# Patient Record
Sex: Male | Born: 1950
Health system: Southern US, Community
[De-identification: ages and names within clinical notes are randomized; demographics above are authoritative.]

## PROBLEM LIST (undated history)

## (undated) DIAGNOSIS — I739 Peripheral vascular disease, unspecified: Secondary | ICD-10-CM

## (undated) DIAGNOSIS — I10 Essential (primary) hypertension: Secondary | ICD-10-CM

## (undated) DIAGNOSIS — B1920 Unspecified viral hepatitis C without hepatic coma: Secondary | ICD-10-CM

## (undated) HISTORY — DX: Unspecified viral hepatitis C without hepatic coma: B19.20

## (undated) HISTORY — DX: Peripheral vascular disease, unspecified: I73.9

## (undated) HISTORY — DX: Essential (primary) hypertension: I10

---

## 2016-06-04 HISTORY — PX: LIPOMA EXCISION: SHX5283

## 2019-06-15 ENCOUNTER — Encounter: Payer: Self-pay | Admitting: Internal Medicine

## 2019-06-15 ENCOUNTER — Other Ambulatory Visit: Payer: Self-pay

## 2019-06-15 ENCOUNTER — Ambulatory Visit (INDEPENDENT_AMBULATORY_CARE_PROVIDER_SITE_OTHER): Payer: Medicare HMO | Admitting: Internal Medicine

## 2019-06-15 VITALS — BP 132/70 | HR 62 | Temp 97.4°F | Ht 65.5 in | Wt 121.0 lb

## 2019-06-15 DIAGNOSIS — R6889 Other general symptoms and signs: Secondary | ICD-10-CM | POA: Diagnosis not present

## 2019-06-15 DIAGNOSIS — F17210 Nicotine dependence, cigarettes, uncomplicated: Secondary | ICD-10-CM | POA: Diagnosis not present

## 2019-06-15 DIAGNOSIS — R109 Unspecified abdominal pain: Secondary | ICD-10-CM | POA: Diagnosis not present

## 2019-06-15 DIAGNOSIS — I739 Peripheral vascular disease, unspecified: Secondary | ICD-10-CM

## 2019-06-15 DIAGNOSIS — I10 Essential (primary) hypertension: Secondary | ICD-10-CM | POA: Diagnosis not present

## 2019-06-15 LAB — COMPREHENSIVE METABOLIC PANEL
ALT: 19 U/L (ref 0–53)
AST: 21 U/L (ref 0–37)
Albumin: 4.2 g/dL (ref 3.5–5.2)
Alkaline Phosphatase: 151 U/L — ABNORMAL HIGH (ref 39–117)
BUN: 11 mg/dL (ref 6–23)
CO2: 33 mEq/L — ABNORMAL HIGH (ref 19–32)
Calcium: 9.6 mg/dL (ref 8.4–10.5)
Chloride: 99 mEq/L (ref 96–112)
Creatinine, Ser: 0.74 mg/dL (ref 0.40–1.50)
GFR: 126.72 mL/min (ref >60.00)
Glucose, Bld: 102 mg/dL — ABNORMAL HIGH (ref 70–99)
Potassium: 4 mEq/L (ref 3.5–5.1)
Sodium: 138 mEq/L (ref 135–145)
Total Bilirubin: 0.3 mg/dL (ref 0.2–1.2)
Total Protein: 7.1 g/dL (ref 6.0–8.3)

## 2019-06-15 LAB — CBC
HCT: 38.9 % — ABNORMAL LOW (ref 39.0–52.0)
Hemoglobin: 12.7 g/dL — ABNORMAL LOW (ref 13.0–17.0)
MCHC: 32.7 g/dL (ref 30.0–36.0)
MCV: 89.5 fl (ref 78.0–100.0)
Platelets: 219 10*3/uL (ref 150.0–400.0)
RBC: 4.35 Mil/uL (ref 4.22–5.81)
RDW: 13.5 % (ref 11.5–15.5)
WBC: 10.4 10*3/uL (ref 4.0–10.5)

## 2019-06-15 LAB — LIPID PANEL
Cholesterol: 196 mg/dL (ref 0–200)
HDL: 39.5 mg/dL (ref >39.00)
LDL Cholesterol: 124 mg/dL — ABNORMAL HIGH (ref 0–99)
NonHDL: 156.95
Total CHOL/HDL Ratio: 5
Triglycerides: 166 mg/dL — ABNORMAL HIGH (ref 0.0–149.0)
VLDL: 33.2 mg/dL (ref 0.0–40.0)

## 2019-06-15 LAB — LIPASE: Lipase: 135 U/L — ABNORMAL HIGH (ref 11.0–59.0)

## 2019-06-15 MED ORDER — OMEPRAZOLE 20 MG PO CPDR
20.0000 mg | DELAYED_RELEASE_CAPSULE | Freq: Every day | ORAL | 11 refills | Status: DC
Start: 2019-06-15 — End: 2019-07-16

## 2019-06-15 NOTE — Assessment & Plan Note (Signed)
Goes back for some time Had CT in [place] (12/20)---suggestive of gastritis and also constipation Colon in 2016--reportedly benign Will try omeprazole and miralax See back in 1 month or so---consider referral to GI

## 2019-06-15 NOTE — Assessment & Plan Note (Signed)
BP Readings from Last 3 Encounters:  06/15/19 132/70   Good control on the amlodipine Will check labs

## 2019-06-15 NOTE — Assessment & Plan Note (Signed)
On pletal No true claudication--but doesn't sound like he does much exertion Discussed the cigarettes

## 2019-06-15 NOTE — Progress Notes (Signed)
Subjective:    Patient ID: Dylan Kaufman, male    DOB: [birth year], 69 y.o.   MRN: [id number]  HPI Here with wife to establish care Moved from [place] --wife is from here This visit occurred during the SARS-CoV-2 public health emergency.  Safety protocols were in place, including screening questions prior to the visit, additional usage of staff PPE, and extensive cleaning of exam room while observing appropriate contact time as indicated for disinfecting solutions.   He retired in 2019 Currently renting a place locally  Known HTN---goes back ~6 years Doing well on the medication No regular exercise No chest pain or SOB  Having a lot of stomach pain Burning sensation in lower abdomen--usually before eating Gets some nausea and occasional vomiting Goes back a year---did try pepcid AC (vomited it back once--and may have helped at first) Feels it was a gas issue Is constipated---dulcolax occasionally Pain may be relieved with moving bowels No blood in stool Did have colonoscopy ~2017 (reportedly okay)  Shows me tests of PVD Severe arterial disease Is limited in how far he can walk  Still smoking ~1/3-1/4th PPD now No regular cough Breathing is okay  Current Outpatient Medications on File Prior to Visit  Medication Sig Dispense Refill  . amLODipine (NORVASC) 10 MG tablet Take 10 mg by mouth daily.    . cilostazol (PLETAL) 50 MG tablet Take 50 mg by mouth 2 (two) times daily.    . Multiple Vitamins-Minerals (CENTRUM SILVER 50+MEN) TABS Take by mouth.     No current facility-administered medications on file prior to visit.    No Known Allergies  History reviewed. No pertinent past medical history.  History reviewed. No pertinent surgical history.  Family History  Problem Relation Age of Onset  . HIV/AIDS Brother     Social History   Socioeconomic History  . Marital status: Married    Spouse name: Not on file  . Number of children: 3  . Years of education: Not  on file  . Highest education level: Not on file  Occupational History  . 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 Smoking status: Current Every Day Smoker    Start date: 17  . Smokeless tobacco: Never Used  Substance and Sexual Activity  . Alcohol use: Yes    Comment: occ beer  . Drug use: Not on file  . Sexual activity: Not on file  Other Topics Concern  . Not on file  Social History Narrative  . Not on file   Social Determinants of Health   Financial Resource Strain:   . Difficulty of Paying Living Expenses:   Food Insecurity:   . Worried About Charity fundraiser in the Last Year:   . Arboriculturist in the Last Year:   Transportation Needs:   . Film/video editor (Medical):   Marland Kitchen Lack of Transportation (Non-Medical):   Physical Activity:   . Days of Exercise per Week:   . Minutes of Exercise per Session:   Stress:   . Feeling of Stress :   Social Connections:   . Frequency of Communication with Friends and Family:   . Frequency of Social Gatherings with Friends and Family:   . Attends Religious Services:   . Active Member of Clubs or Organizations:   . Attends Archivist Meetings:   Marland Kitchen Marital Status:   Intimate Partner Violence:   . Fear of Current or Ex-Partner:   .  Emotionally Abused:   Marland Kitchen Physically Abused:   . Sexually Abused:    Review of Systems Appetite is okay Weight is down some----10# or so Sleeping better now--occasional awakening Voids okay---stream is fine.nocturia x 1-2 No depression or anhedonia Not anxious    Objective:   Physical Exam  Constitutional: No distress.  HENT:  Mouth/Throat: Oropharynx is clear and moist.  Full dentures No oral lesions  Neck: No thyromegaly present.  Cardiovascular: Normal rate, regular rhythm and normal heart sounds. Exam reveals no gallop.  No murmur heard. Absent pedal pulses  Respiratory: Effort normal and breath sounds normal. No respiratory  distress. He has no wheezes. He has no rales.  GI: Soft. He exhibits no distension. There is no abdominal tenderness. There is no rebound and no guarding.  Musculoskeletal:        General: No edema.     Comments: Large lipoma right upper back  Lymphadenopathy:    He has no cervical adenopathy.  Psychiatric: He has a normal mood and affect. His behavior is normal.           Assessment & Plan:

## 2019-06-15 NOTE — Assessment & Plan Note (Signed)
Discussed cessation  Patch/gum nicotine for now

## 2019-06-15 NOTE — Patient Instructions (Signed)
Please start the omeprazole (prescription) daily on an empty stomach (bedtime would be a good time). Also start miralax (over the counter) 1 full capful in a glass of water daily (to try to get your bowels more regular).  I would like you to stop smoking. Start an over the counter nicotine patch 21 or 22mg  daily. Then stop all cigarettes. You can use nicotine gum as needed to help you if you feel a craving for a cigarette (but don't smoke!)  Please get your COVID vaccine

## 2019-07-16 ENCOUNTER — Other Ambulatory Visit: Payer: Self-pay

## 2019-07-16 ENCOUNTER — Encounter: Payer: Self-pay | Admitting: Nurse Practitioner

## 2019-07-16 ENCOUNTER — Encounter: Payer: Self-pay | Admitting: Internal Medicine

## 2019-07-16 ENCOUNTER — Ambulatory Visit (INDEPENDENT_AMBULATORY_CARE_PROVIDER_SITE_OTHER): Payer: Medicare HMO | Admitting: Internal Medicine

## 2019-07-16 VITALS — BP 132/82 | HR 72 | Temp 98.3°F | Ht 66.0 in | Wt 127.0 lb

## 2019-07-16 DIAGNOSIS — R1013 Epigastric pain: Secondary | ICD-10-CM | POA: Diagnosis not present

## 2019-07-16 DIAGNOSIS — M545 Low back pain, unspecified: Secondary | ICD-10-CM

## 2019-07-16 DIAGNOSIS — R6889 Other general symptoms and signs: Secondary | ICD-10-CM | POA: Diagnosis not present

## 2019-07-16 MED ORDER — TIZANIDINE HCL 2 MG PO TABS: 2.0000 mg | ORAL_TABLET | Freq: Three times a day (TID) | ORAL | 0 refills | Status: DC | PRN

## 2019-07-16 NOTE — Progress Notes (Signed)
Subjective:    Patient ID: Dylan Kaufman, male    DOB: [birth year], 69 y.o.   MRN: [id number]  HPI Here for follow up of abdominal pain With wife This visit occurred during the SARS-CoV-2 public health emergency.  Safety protocols were in place, including screening questions prior to the visit, additional usage of staff PPE, and extensive cleaning of exam room while observing appropriate contact time as indicated for disinfecting solutions.   Having new pain in lower abdomen Noticed it after lifting left arm Around lower left flank---and around to back No rash Notices it if he moves the wrong way-- "it stops me"  The abdominal pain did improve Still gets bloated when eating Bowels are better Stopped the omeprazole Tried ibuprofen--helps (less severe pain)  Current Outpatient Medications on File Prior to Visit  Medication Sig Dispense Refill  . amLODipine (NORVASC) 10 MG tablet Take 10 mg by mouth daily.    . cilostazol (PLETAL) 50 MG tablet Take 50 mg by mouth 2 (two) times daily.    . ferrous sulfate 325 (65 FE) MG tablet Take 325 mg by mouth daily with breakfast.    . Multiple Vitamins-Minerals (CENTRUM SILVER 50+MEN) TABS Take by mouth.     No current facility-administered medications on file prior to visit.    Not on File  Past Medical History:  Diagnosis Date  . Hepatitis C infection    treated with harvoni  . Hypertension   . PAD (peripheral artery disease) ([place])     Past Surgical History:  Procedure Laterality Date  . LIPOMA EXCISION  06/2016   in left groin--- ED since then    Family History  Problem Relation Age of Onset  . HIV/AIDS Brother     Social History   Socioeconomic History  . Marital status: Married    Spouse name: Not on file  . Number of children: 3  . Years of education: Not on file  . Highest education level: Not on file  Occupational History  . 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  Smoking status: Current Every Day Smoker    Start date: 16  . Smokeless tobacco: Never Used  Substance and Sexual Activity  . Alcohol use: Yes    Comment: occ beer  . Drug use: Not on file  . Sexual activity: Not on file  Other Topics Concern  . Not on file  Social History Narrative  . Not on file   Social Determinants of Health   Financial Resource Strain:   . Difficulty of Paying Living Expenses:   Food Insecurity:   . Worried About Charity fundraiser in the Last Year:   . Arboriculturist in the Last Year:   Transportation Needs:   . Film/video editor (Medical):   Marland Kitchen Lack of Transportation (Non-Medical):   Physical Activity:   . Days of Exercise per Week:   . Minutes of Exercise per Session:   Stress:   . Feeling of Stress :   Social Connections:   . Frequency of Communication with Friends and Family:   . Frequency of Social Gatherings with Friends and Family:   . Attends Religious Services:   . Active Member of Clubs or Organizations:   . Attends Archivist Meetings:   Marland Kitchen Marital Status:   Intimate Partner Violence:   . Fear of Current or Ex-Partner:   . Emotionally Abused:   Marland Kitchen Physically Abused:   .  Sexually Abused:    Review of Systems  Appetite is good--but slowing up on eating (gets early satiety) Weight is up some     Objective:   Physical Exam  GI: Soft.  Slight generalized tenderness  Musculoskeletal:     Comments: Tenderness lateral low lumbar area---not really over spine. SLR negative Mild restriction of ROM in hips but not bad and no pain with this Back pain worse with movement           Assessment & Plan:

## 2019-07-16 NOTE — Assessment & Plan Note (Signed)
Some better but still bloating, etc FODMAP info given Abnormal alk phos and lipase--as well as mild anemia Will check FIT GI evaluation

## 2019-07-16 NOTE — Assessment & Plan Note (Signed)
Seems muscular Discussed heat, tylenol  Muscle relaxer for prn

## 2019-07-21 ENCOUNTER — Other Ambulatory Visit: Payer: Self-pay

## 2019-07-21 ENCOUNTER — Emergency Department (HOSPITAL_COMMUNITY)
Admission: EM | Admit: 2019-07-21 | Discharge: 2019-07-21 | Disposition: A | Discharge status: Home / Self Care | Payer: Medicare HMO | Attending: Emergency Medicine | Admitting: Emergency Medicine

## 2019-07-21 ENCOUNTER — Encounter (HOSPITAL_COMMUNITY): Payer: Self-pay | Admitting: *Deleted

## 2019-07-21 ENCOUNTER — Emergency Department (HOSPITAL_COMMUNITY): Payer: Medicare HMO

## 2019-07-21 DIAGNOSIS — M546 Pain in thoracic spine: Secondary | ICD-10-CM | POA: Insufficient documentation

## 2019-07-21 DIAGNOSIS — I10 Essential (primary) hypertension: Secondary | ICD-10-CM | POA: Insufficient documentation

## 2019-07-21 DIAGNOSIS — Z79899 Other long term (current) drug therapy: Secondary | ICD-10-CM | POA: Insufficient documentation

## 2019-07-21 DIAGNOSIS — J984 Other disorders of lung: Secondary | ICD-10-CM | POA: Diagnosis not present

## 2019-07-21 DIAGNOSIS — M545 Low back pain: Secondary | ICD-10-CM | POA: Diagnosis not present

## 2019-07-21 DIAGNOSIS — I739 Peripheral vascular disease, unspecified: Secondary | ICD-10-CM | POA: Insufficient documentation

## 2019-07-21 DIAGNOSIS — F17210 Nicotine dependence, cigarettes, uncomplicated: Secondary | ICD-10-CM | POA: Diagnosis not present

## 2019-07-21 LAB — URINALYSIS, ROUTINE W REFLEX MICROSCOPIC
Bilirubin Urine: NEGATIVE
Glucose, UA: NEGATIVE mg/dL
Hgb urine dipstick: NEGATIVE
Ketones, ur: 20 mg/dL — AB
Leukocytes,Ua: NEGATIVE
Nitrite: NEGATIVE
Protein, ur: 100 mg/dL — AB
Specific Gravity, Urine: 1.03 (ref 1.005–1.030)
pH: 5 (ref 5.0–8.0)

## 2019-07-21 LAB — CBC WITH DIFFERENTIAL/PLATELET
Abs Immature Granulocytes: 1.51 10*3/uL — ABNORMAL HIGH (ref 0.00–0.07)
Basophils Absolute: 0.1 10*3/uL (ref 0.0–0.1)
Basophils Relative: 0 %
Eosinophils Absolute: 0.3 10*3/uL (ref 0.0–0.5)
Eosinophils Relative: 2 %
HCT: 36.4 % — ABNORMAL LOW (ref 39.0–52.0)
Hemoglobin: 11.9 g/dL — ABNORMAL LOW (ref 13.0–17.0)
Immature Granulocytes: 10 %
Lymphocytes Relative: 22 %
Lymphs Abs: 3.3 10*3/uL (ref 0.7–4.0)
MCH: 28.8 pg (ref 26.0–34.0)
MCHC: 32.7 g/dL (ref 30.0–36.0)
MCV: 88.1 fL (ref 80.0–100.0)
Monocytes Absolute: 1.3 10*3/uL — ABNORMAL HIGH (ref 0.1–1.0)
Monocytes Relative: 9 %
Neutro Abs: 8.8 10*3/uL — ABNORMAL HIGH (ref 1.7–7.7)
Neutrophils Relative %: 57 %
Platelets: 81 10*3/uL — ABNORMAL LOW (ref 150–400)
RBC: 4.13 MIL/uL — ABNORMAL LOW (ref 4.22–5.81)
RDW: 13.6 % (ref 11.5–15.5)
WBC: 15.3 10*3/uL — ABNORMAL HIGH (ref 4.0–10.5)
nRBC: 0.8 % — ABNORMAL HIGH (ref 0.0–0.2)

## 2019-07-21 LAB — BASIC METABOLIC PANEL
Anion gap: 14 (ref 5–15)
BUN: 17 mg/dL (ref 8–23)
CO2: 23 mmol/L (ref 22–32)
Calcium: 9.5 mg/dL (ref 8.9–10.3)
Chloride: 101 mmol/L (ref 98–111)
Creatinine, Ser: 0.78 mg/dL (ref 0.61–1.24)
GFR calc Af Amer: >60 mL/min (ref >60)
GFR calc non Af Amer: >60 mL/min (ref >60)
Glucose, Bld: 112 mg/dL — ABNORMAL HIGH (ref 70–99)
Potassium: 4 mmol/L (ref 3.5–5.1)
Sodium: 138 mmol/L (ref 135–145)

## 2019-07-21 LAB — SEDIMENTATION RATE: Sed Rate: 2 mm/hr (ref 0–16)

## 2019-07-21 MED ORDER — OXYCODONE-ACETAMINOPHEN 5-325 MG PO TABS
1.0000 | ORAL_TABLET | Freq: Once | ORAL | Status: AC
  Administered 2019-07-21: 1 via ORAL
  Filled 2019-07-21: qty 1

## 2019-07-21 MED ORDER — OXYCODONE-ACETAMINOPHEN 5-325 MG PO TABS: 1.0000 | ORAL_TABLET | ORAL | 0 refills | Status: DC | PRN

## 2019-07-21 NOTE — ED Triage Notes (Signed)
Pt is here with entire back hurting that started 3 weeks. Hurts with movement.  Pt doctor referred him to gastroenterologist but does not have appt till July.  Not sure if this back pain is related.  Denies chest or abdominal pain

## 2019-07-21 NOTE — ED Notes (Signed)
Discharge instructions discussed with pt. Pt verbalized understanding. Pt stable and ambulatory. No signature pad available. 

## 2019-07-21 NOTE — Discharge Instructions (Addendum)
Use heat on the sore area your back 3-4 times a day to help the pain.  Follow-up with your doctor next week for checkup.

## 2019-07-21 NOTE — ED Provider Notes (Signed)
[facility] Provider Note   CSN: [id number] Arrival date & time: 07/21/19  1339     History Chief Complaint  Patient presents with  . Back Pain    Dylan Kaufman is a 69 y.o. male.  HPI He complains of low back pain, present for about 3 weeks, worsening over the last week, worse with movement, especially extension of the back.  He saw his PCP 3 weeks ago and was treated at that time with tizanidine for muscle spasm.  He was also referred to GI because he was having some abdominal discomfort in the low mid abdomen.  Patient relates that he has been eating well, not having fever, vomiting or diarrhea.  He does not know of any specific trauma.  He has gained about 6 pounds in the last 3 weeks.  No prior similar problems.  There are no other known modifying factors.    Past Medical History:  Diagnosis Date  . Hepatitis C infection    treated with harvoni  . Hypertension   . PAD (peripheral artery disease) [facility])     Patient Active Problem List   Diagnosis Date Noted  . Acute low back pain 07/16/2019  . Abdominal pain 06/15/2019  . Cigarette smoker 06/15/2019  . Hypertension   . PAD (peripheral artery disease) ([place])     Past Surgical History:  Procedure Laterality Date  . LIPOMA EXCISION  06/2016   in left groin--- ED since then       Family History  Problem Relation Age of Onset  . HIV/AIDS Brother     Social History   Tobacco Use  . Smoking status: Current Every Day Smoker    Start date: 37  . Smokeless tobacco: Never Used  Substance Use Topics  . Alcohol use: Yes    Comment: occ beer  . Drug use: Not on file    Home Medications Prior to Admission medications   Medication Sig Start Date End Date Taking? Authorizing Provider  amLODipine (NORVASC) 10 MG tablet Take 10 mg by mouth daily.    [provider]  cilostazol (PLETAL) 50 MG tablet Take 50 mg by mouth 2 (two) times daily.    [provider]    ferrous sulfate 325 (65 FE) MG tablet Take 325 mg by mouth daily with breakfast.    [provider]  Multiple Vitamins-Minerals (CENTRUM SILVER 50+MEN) TABS Take by mouth.    [provider]  oxyCODONE-acetaminophen (PERCOCET) 5-325 MG tablet Take 1 tablet by mouth every 4 (four) hours as needed. 07/21/19   Daleen Bo, MD  tiZANidine (ZANAFLEX) 2 MG tablet Take 1 tablet (2 mg total) by mouth 3 (three) times daily as needed for muscle spasms. 07/16/19   Venia Carbon, MD    Allergies    Patient has no known allergies.  Review of Systems   Review of Systems  All other systems reviewed and are negative.   Physical Exam Updated Vital Signs BP (!) 149/98 (BP Location: Left Arm)   Pulse 86   Temp 98.5 F (36.9 C) (Oral)   Resp 17   SpO2 100%   Physical Exam Vitals and nursing note reviewed.  Constitutional:      General: He is in acute distress.     Appearance: He is well-developed. He is not ill-appearing, toxic-appearing or diaphoretic.  HENT:     Head: Normocephalic and atraumatic.     Right Ear: External ear normal.     Left  Ear: External ear normal.  Eyes:     Conjunctiva/sclera: Conjunctivae normal.     Pupils: Pupils are equal, round, and reactive to light.  Neck:     Trachea: Phonation normal.  Cardiovascular:     Rate and Rhythm: Normal rate and regular rhythm.     Heart sounds: Normal heart sounds.  Pulmonary:     Effort: Pulmonary effort is normal. No respiratory distress.     Breath sounds: Normal breath sounds. No stridor.  Abdominal:     General: There is no distension.     Palpations: Abdomen is soft.     Tenderness: There is no abdominal tenderness.  Musculoskeletal:        General: Normal range of motion.     Cervical back: Normal range of motion and neck supple.     Comments: Tender lower thoracic region, fairly diffuse, with increased pain at the site with extension of the back.  No visible deformity.  Skin:    General: Skin  is warm and dry.  Neurological:     Mental Status: He is alert and oriented to person, place, and time.     Cranial Nerves: No cranial nerve deficit.     Sensory: No sensory deficit.     Motor: No abnormal muscle tone.     Coordination: Coordination normal.  Psychiatric:        Mood and Affect: Mood normal.        Behavior: Behavior normal.        Thought Content: Thought content normal.        Judgment: Judgment normal.     ED Results / Procedures / Treatments   Labs (all labs ordered are listed, but only abnormal results are displayed) Labs Reviewed  BASIC METABOLIC PANEL - Abnormal; Notable for the following components:      Result Value   Glucose, Bld 112 (*)    All other components within normal limits  CBC WITH DIFFERENTIAL/PLATELET - Abnormal; Notable for the following components:   WBC 15.3 (*)    RBC 4.13 (*)    Hemoglobin 11.9 (*)    HCT 36.4 (*)    Platelets 81 (*)    nRBC 0.8 (*)    Neutro Abs 8.8 (*)    Monocytes Absolute 1.3 (*)    Abs Immature Granulocytes 1.51 (*)    All other components within normal limits  URINALYSIS, ROUTINE W REFLEX MICROSCOPIC - Abnormal; Notable for the following components:   Color, Urine AMBER (*)    Ketones, ur 20 (*)    Protein, ur 100 (*)    Bacteria, UA RARE (*)    All other components within normal limits  SEDIMENTATION RATE    EKG None  Radiology DG Chest 2 View  Result Date: 07/21/2019 CLINICAL DATA:  Left back pain EXAM: CHEST - 2 VIEW COMPARISON:  None. FINDINGS: Heart and mediastinal contours are within normal limits. Left basilar linear densities, likely scarring. No confluent opacities otherwise. No effusions. No acute bony abnormality. Old healed left rib fractures. IMPRESSION: No active cardiopulmonary disease. Electronically Signed   By: Rolm Baptise M.D.   On: 07/21/2019 21:30   DG Thoracic Spine 2 View  Result Date: 07/21/2019 CLINICAL DATA:  Left side back pain EXAM: THORACIC SPINE 2 VIEWS COMPARISON:   None. FINDINGS: No fracture or malalignment.  No focal bone lesion. IMPRESSION: No acute bony abnormality. Electronically Signed   By: Rolm Baptise M.D.   On: 07/21/2019 21:29   DG  Lumbar Spine Complete  Result Date: 07/21/2019 CLINICAL DATA:  Left back pain EXAM: LUMBAR SPINE - COMPLETE 4+ VIEW COMPARISON:  None. FINDINGS: Normal alignment. No fracture. Disc spaces maintained. Aortic atherosclerosis. No aneurysm. IMPRESSION: No acute bony abnormality. Electronically Signed   By: Rolm Baptise M.D.   On: 07/21/2019 21:30    Procedures Procedures (including critical care time)  Medications Ordered in ED Medications  oxyCODONE-acetaminophen (PERCOCET/ROXICET) 5-325 MG per tablet 1 tablet (has no administration in time range)  oxyCODONE-acetaminophen (PERCOCET/ROXICET) 5-325 MG per tablet 1 tablet (1 tablet Oral Given 07/21/19 2038)    ED Course  I have reviewed the triage vital signs and the nursing notes.  Pertinent labs & imaging results that were available during my care of the patient were reviewed by me and considered in my medical decision making (see chart for details).  Clinical Course as of Jul 20 2333  Wed Jul 21, 2019  2229 Normal except white count high, hemoglobin low, platelets low  CBC with Differential(!) [EW]  2230 Normal except glucose high  Basic metabolic panel(!) [EW]  4709 No fracture or dislocation interpreted by me  DG Thoracic Spine 2 View [EW]  2231 No infiltrate or CHF, interpreted by me  DG Chest 2 View [EW]  2231 No fracture or dislocation, interpreted by me  DG Lumbar Spine Complete [EW]  2304 Normal  Sedimentation rate [EW]  2329 Normal except presence of small amount of ketones, protein white cells and and rare bacteria  Urinalysis, Routine w reflex microscopic(!) [EW]    Clinical Course User Index [EW] Daleen Bo, MD   MDM Rules/Calculators/A&P                           Patient Vitals for the past 24 hrs:  BP Temp Temp src Pulse Resp SpO2   07/21/19 1839 (!) 149/98 -- -- 86 17 100 %  07/21/19 1344 (!) 161/87 98.5 F (36.9 C) Oral 95 16 97 %    11:35 PM Reevaluation with update and discussion. After initial assessment and treatment, an updated evaluation reveals he is more comfortable has no further complaints.  Findings discussed with the patient and his wife, all questions were answered. Daleen Bo   Medical Decision Making:  This patient is presenting for evaluation of mid back pain, which does require a range of treatment options, and is a complaint that involves a moderate risk of morbidity and mortality. The differential diagnoses include muscle strain, spine disorder. I decided to review old records, and in summary radiculopathy.  I did not require additional historical information from anyone.  Clinical Laboratory Tests Ordered, included CBC, Metabolic panel and Urinalysis. Review indicates mild white blood cell count elevation and hemoglobin and platelets low. Radiologic Tests Ordered, included imaging thoracic spine, lumbar spine and chest x-ray.  I independently Visualized: Radiographic images, which show no acute abnormalities    Critical Interventions-clinical evaluation, medication treatment, laboratory testing, radiographic imaging, observation and reassessment  After These Interventions, the Patient was reevaluated and was found stable for discharge.  Suspect musculoskeletal mid back pain, without evidence for serious bacterial infection or hemodynamic instability.  CRITICAL CARE-no Performed by: Daleen Bo  Nursing Notes Reviewed/ Care Coordinated Applicable Imaging Reviewed Interpretation of Laboratory Data incorporated into ED treatment  The patient appears reasonably screened and/or stabilized for discharge and I doubt any other medical condition or other [facility] requiring further screening, evaluation, or treatment in the ED at this time prior  to discharge.  Plan: Home Medications-continue usual;  Home Treatments-heat to affected area; return here if the recommended treatment, does not improve the symptoms; Recommended follow up-PCP next week for checkup     Final Clinical Impression(s) / ED Diagnoses Final diagnoses:  Thoracic back pain, unspecified back pain laterality, unspecified chronicity    Rx / DC Orders ED Discharge Orders         Ordered    oxyCODONE-acetaminophen (PERCOCET) 5-325 MG tablet  Every 4 hours PRN     Discontinue  Reprint     07/21/19 2335           Daleen Bo, MD 07/21/19 2336

## 2019-07-22 ENCOUNTER — Telehealth: Payer: Self-pay

## 2019-07-22 NOTE — Telephone Encounter (Signed)
Called to check on pt to see how he is doing after recent ER visit yesterday for back pain. Spoke to wife. Dylan Kaufman he was doing a little better today. He is trying to move around a little more than he had been.

## 2019-07-26 ENCOUNTER — Telehealth: Payer: Self-pay | Admitting: Internal Medicine

## 2019-07-26 ENCOUNTER — Telehealth: Payer: Self-pay | Admitting: Gastroenterology

## 2019-07-26 NOTE — Telephone Encounter (Signed)
I did make a GI referral for him. We can see where we are about getting him in

## 2019-07-26 NOTE — Telephone Encounter (Signed)
Received call from PCP asking the referral for this patient be moved up because he had to go to the ED with abdominal pain, bloating and bile emesis. Moved to 08/23/2019 and called patient with change.

## 2019-07-26 NOTE — Telephone Encounter (Signed)
Will forward to [place] and [employer]

## 2019-07-26 NOTE — Telephone Encounter (Signed)
Patients wife called today.   She stated that the patient was in on 6/11 and given medication for muscle spasm in his stomach. She stated that he did not improve and on 6/16 the patient ended up going to the ED. She said that they ran test and did lab work but everything came back normal. The ED prescribed the patients oxycodone but patient said that it is not helping at all. He is still having back/stomach pain. And is very bloated.   They would like to know if there is something else he could be prescribed or what he needed to do next since the medications do not seem to be helping and he is still having a lot of pain

## 2019-07-26 NOTE — Telephone Encounter (Signed)
[facility] is calling pt and triaging to get pt in sooner.

## 2019-07-27 NOTE — Telephone Encounter (Signed)
Pt's appt moved to 08/29/2019

## 2019-07-27 NOTE — Telephone Encounter (Signed)
Thanks

## 2019-08-01 ENCOUNTER — Inpatient Hospital Stay (HOSPITAL_COMMUNITY)
Admission: EM | Admit: 2019-08-01 | Discharge: 2019-08-05 | DRG: 374 | Disposition: E | Payer: Medicare HMO | Attending: Internal Medicine | Admitting: Internal Medicine

## 2019-08-01 ENCOUNTER — Encounter (HOSPITAL_COMMUNITY): Payer: Self-pay | Admitting: Emergency Medicine

## 2019-08-01 DIAGNOSIS — I214 Non-ST elevation (NSTEMI) myocardial infarction: Secondary | ICD-10-CM | POA: Diagnosis not present

## 2019-08-01 DIAGNOSIS — R338 Other retention of urine: Secondary | ICD-10-CM | POA: Diagnosis present

## 2019-08-01 DIAGNOSIS — Z20822 Contact with and (suspected) exposure to covid-19: Secondary | ICD-10-CM | POA: Diagnosis present

## 2019-08-01 DIAGNOSIS — R0602 Shortness of breath: Secondary | ICD-10-CM | POA: Diagnosis not present

## 2019-08-01 DIAGNOSIS — C7951 Secondary malignant neoplasm of bone: Secondary | ICD-10-CM | POA: Diagnosis present

## 2019-08-01 DIAGNOSIS — I739 Peripheral vascular disease, unspecified: Secondary | ICD-10-CM | POA: Diagnosis present

## 2019-08-01 DIAGNOSIS — F40240 Claustrophobia: Secondary | ICD-10-CM | POA: Diagnosis present

## 2019-08-01 DIAGNOSIS — I248 Other forms of acute ischemic heart disease: Secondary | ICD-10-CM | POA: Diagnosis not present

## 2019-08-01 DIAGNOSIS — Z7189 Other specified counseling: Secondary | ICD-10-CM | POA: Diagnosis not present

## 2019-08-01 DIAGNOSIS — R32 Unspecified urinary incontinence: Secondary | ICD-10-CM | POA: Diagnosis present

## 2019-08-01 DIAGNOSIS — R52 Pain, unspecified: Secondary | ICD-10-CM | POA: Diagnosis not present

## 2019-08-01 DIAGNOSIS — Z8619 Personal history of other infectious and parasitic diseases: Secondary | ICD-10-CM | POA: Diagnosis not present

## 2019-08-01 DIAGNOSIS — G9341 Metabolic encephalopathy: Secondary | ICD-10-CM | POA: Diagnosis present

## 2019-08-01 DIAGNOSIS — D649 Anemia, unspecified: Secondary | ICD-10-CM | POA: Diagnosis present

## 2019-08-01 DIAGNOSIS — R944 Abnormal results of kidney function studies: Secondary | ICD-10-CM | POA: Diagnosis present

## 2019-08-01 DIAGNOSIS — Z515 Encounter for palliative care: Secondary | ICD-10-CM | POA: Diagnosis not present

## 2019-08-01 DIAGNOSIS — I700 Atherosclerosis of aorta: Secondary | ICD-10-CM | POA: Diagnosis not present

## 2019-08-01 DIAGNOSIS — R079 Chest pain, unspecified: Secondary | ICD-10-CM | POA: Diagnosis not present

## 2019-08-01 DIAGNOSIS — M5386 Other specified dorsopathies, lumbar region: Secondary | ICD-10-CM | POA: Diagnosis not present

## 2019-08-01 DIAGNOSIS — G9520 Unspecified cord compression: Secondary | ICD-10-CM | POA: Diagnosis present

## 2019-08-01 DIAGNOSIS — R935 Abnormal findings on diagnostic imaging of other abdominal regions, including retroperitoneum: Secondary | ICD-10-CM | POA: Diagnosis not present

## 2019-08-01 DIAGNOSIS — D630 Anemia in neoplastic disease: Secondary | ICD-10-CM | POA: Diagnosis present

## 2019-08-01 DIAGNOSIS — K5909 Other constipation: Secondary | ICD-10-CM | POA: Diagnosis present

## 2019-08-01 DIAGNOSIS — R64 Cachexia: Secondary | ICD-10-CM | POA: Diagnosis present

## 2019-08-01 DIAGNOSIS — I708 Atherosclerosis of other arteries: Secondary | ICD-10-CM | POA: Diagnosis not present

## 2019-08-01 DIAGNOSIS — G893 Neoplasm related pain (acute) (chronic): Secondary | ICD-10-CM | POA: Diagnosis present

## 2019-08-01 DIAGNOSIS — E43 Unspecified severe protein-calorie malnutrition: Secondary | ICD-10-CM | POA: Diagnosis present

## 2019-08-01 DIAGNOSIS — I771 Stricture of artery: Secondary | ICD-10-CM | POA: Diagnosis not present

## 2019-08-01 DIAGNOSIS — Z66 Do not resuscitate: Secondary | ICD-10-CM | POA: Diagnosis not present

## 2019-08-01 DIAGNOSIS — C163 Malignant neoplasm of pyloric antrum: Principal | ICD-10-CM | POA: Diagnosis present

## 2019-08-01 DIAGNOSIS — R188 Other ascites: Secondary | ICD-10-CM | POA: Diagnosis present

## 2019-08-01 DIAGNOSIS — F17210 Nicotine dependence, cigarettes, uncomplicated: Secondary | ICD-10-CM | POA: Diagnosis present

## 2019-08-01 DIAGNOSIS — Z7902 Long term (current) use of antithrombotics/antiplatelets: Secondary | ICD-10-CM

## 2019-08-01 DIAGNOSIS — R531 Weakness: Secondary | ICD-10-CM | POA: Diagnosis present

## 2019-08-01 DIAGNOSIS — G459 Transient cerebral ischemic attack, unspecified: Secondary | ICD-10-CM | POA: Diagnosis not present

## 2019-08-01 DIAGNOSIS — R262 Difficulty in walking, not elsewhere classified: Secondary | ICD-10-CM | POA: Diagnosis present

## 2019-08-01 DIAGNOSIS — R112 Nausea with vomiting, unspecified: Secondary | ICD-10-CM | POA: Diagnosis present

## 2019-08-01 DIAGNOSIS — C801 Malignant (primary) neoplasm, unspecified: Secondary | ICD-10-CM

## 2019-08-01 DIAGNOSIS — R202 Paresthesia of skin: Secondary | ICD-10-CM | POA: Diagnosis not present

## 2019-08-01 DIAGNOSIS — I21A1 Myocardial infarction type 2: Secondary | ICD-10-CM | POA: Diagnosis present

## 2019-08-01 DIAGNOSIS — S32010A Wedge compression fracture of first lumbar vertebra, initial encounter for closed fracture: Secondary | ICD-10-CM | POA: Diagnosis not present

## 2019-08-01 DIAGNOSIS — R180 Malignant ascites: Secondary | ICD-10-CM | POA: Diagnosis not present

## 2019-08-01 DIAGNOSIS — E876 Hypokalemia: Secondary | ICD-10-CM | POA: Diagnosis present

## 2019-08-01 DIAGNOSIS — K5900 Constipation, unspecified: Secondary | ICD-10-CM | POA: Diagnosis not present

## 2019-08-01 DIAGNOSIS — D62 Acute posthemorrhagic anemia: Secondary | ICD-10-CM | POA: Diagnosis present

## 2019-08-01 DIAGNOSIS — I2510 Atherosclerotic heart disease of native coronary artery without angina pectoris: Secondary | ICD-10-CM | POA: Diagnosis not present

## 2019-08-01 DIAGNOSIS — D689 Coagulation defect, unspecified: Secondary | ICD-10-CM | POA: Diagnosis present

## 2019-08-01 DIAGNOSIS — G8220 Paraplegia, unspecified: Secondary | ICD-10-CM | POA: Diagnosis present

## 2019-08-01 DIAGNOSIS — D696 Thrombocytopenia, unspecified: Secondary | ICD-10-CM | POA: Diagnosis present

## 2019-08-01 DIAGNOSIS — Z79899 Other long term (current) drug therapy: Secondary | ICD-10-CM

## 2019-08-01 DIAGNOSIS — R1013 Epigastric pain: Secondary | ICD-10-CM

## 2019-08-01 DIAGNOSIS — J9811 Atelectasis: Secondary | ICD-10-CM | POA: Diagnosis present

## 2019-08-01 DIAGNOSIS — Z6820 Body mass index (BMI) 20.0-20.9, adult: Secondary | ICD-10-CM

## 2019-08-01 DIAGNOSIS — I10 Essential (primary) hypertension: Secondary | ICD-10-CM | POA: Diagnosis present

## 2019-08-01 DIAGNOSIS — R778 Other specified abnormalities of plasma proteins: Secondary | ICD-10-CM | POA: Diagnosis not present

## 2019-08-01 DIAGNOSIS — D72829 Elevated white blood cell count, unspecified: Secondary | ICD-10-CM | POA: Diagnosis present

## 2019-08-01 DIAGNOSIS — M8448XA Pathological fracture, other site, initial encounter for fracture: Secondary | ICD-10-CM | POA: Diagnosis present

## 2019-08-01 DIAGNOSIS — K3189 Other diseases of stomach and duodenum: Secondary | ICD-10-CM | POA: Diagnosis not present

## 2019-08-01 DIAGNOSIS — R1084 Generalized abdominal pain: Secondary | ICD-10-CM | POA: Diagnosis not present

## 2019-08-01 DIAGNOSIS — R7989 Other specified abnormal findings of blood chemistry: Secondary | ICD-10-CM | POA: Diagnosis present

## 2019-08-01 MED ORDER — SODIUM CHLORIDE 0.9% FLUSH
3.0000 mL | Freq: Once | INTRAVENOUS | Status: AC
  Administered 2019-08-02: 3 mL via INTRAVENOUS

## 2019-08-01 NOTE — ED Notes (Addendum)
Pt refusing lab work

## 2019-08-01 NOTE — ED Triage Notes (Signed)
Per EMS, Pt from home, c/o abdominal pn and constipation X 3-4 days.  Reports no bowel movements in 3-4 days but denies n/v/fevers.  "I need antibiotics to flush the cold and inflammation out of my body."  OTC medication did not relieve symptoms rigid abdomen.    110 HR RR18 124 bp pal. 95% on RA Temp 97.5

## 2019-08-02 ENCOUNTER — Emergency Department (HOSPITAL_COMMUNITY): Payer: Medicare HMO

## 2019-08-02 ENCOUNTER — Inpatient Hospital Stay (HOSPITAL_COMMUNITY): Payer: Medicare HMO

## 2019-08-02 ENCOUNTER — Encounter (HOSPITAL_COMMUNITY): Payer: Self-pay | Admitting: Internal Medicine

## 2019-08-02 DIAGNOSIS — R64 Cachexia: Secondary | ICD-10-CM | POA: Diagnosis present

## 2019-08-02 DIAGNOSIS — K3189 Other diseases of stomach and duodenum: Secondary | ICD-10-CM | POA: Diagnosis not present

## 2019-08-02 DIAGNOSIS — R935 Abnormal findings on diagnostic imaging of other abdominal regions, including retroperitoneum: Secondary | ICD-10-CM

## 2019-08-02 DIAGNOSIS — F17210 Nicotine dependence, cigarettes, uncomplicated: Secondary | ICD-10-CM | POA: Diagnosis present

## 2019-08-02 DIAGNOSIS — G8220 Paraplegia, unspecified: Secondary | ICD-10-CM | POA: Diagnosis present

## 2019-08-02 DIAGNOSIS — M8448XA Pathological fracture, other site, initial encounter for fracture: Secondary | ICD-10-CM | POA: Diagnosis present

## 2019-08-02 DIAGNOSIS — C7951 Secondary malignant neoplasm of bone: Secondary | ICD-10-CM | POA: Diagnosis present

## 2019-08-02 DIAGNOSIS — Z20822 Contact with and (suspected) exposure to covid-19: Secondary | ICD-10-CM | POA: Diagnosis present

## 2019-08-02 DIAGNOSIS — R180 Malignant ascites: Secondary | ICD-10-CM | POA: Diagnosis not present

## 2019-08-02 DIAGNOSIS — G9341 Metabolic encephalopathy: Secondary | ICD-10-CM | POA: Diagnosis present

## 2019-08-02 DIAGNOSIS — I248 Other forms of acute ischemic heart disease: Secondary | ICD-10-CM

## 2019-08-02 DIAGNOSIS — R188 Other ascites: Secondary | ICD-10-CM | POA: Diagnosis present

## 2019-08-02 DIAGNOSIS — I2510 Atherosclerotic heart disease of native coronary artery without angina pectoris: Secondary | ICD-10-CM | POA: Diagnosis not present

## 2019-08-02 DIAGNOSIS — G9520 Unspecified cord compression: Secondary | ICD-10-CM | POA: Diagnosis present

## 2019-08-02 DIAGNOSIS — R0602 Shortness of breath: Secondary | ICD-10-CM

## 2019-08-02 DIAGNOSIS — R338 Other retention of urine: Secondary | ICD-10-CM | POA: Diagnosis present

## 2019-08-02 DIAGNOSIS — D62 Acute posthemorrhagic anemia: Secondary | ICD-10-CM | POA: Diagnosis present

## 2019-08-02 DIAGNOSIS — R079 Chest pain, unspecified: Secondary | ICD-10-CM | POA: Diagnosis not present

## 2019-08-02 DIAGNOSIS — D649 Anemia, unspecified: Secondary | ICD-10-CM | POA: Diagnosis present

## 2019-08-02 DIAGNOSIS — I214 Non-ST elevation (NSTEMI) myocardial infarction: Secondary | ICD-10-CM | POA: Diagnosis not present

## 2019-08-02 DIAGNOSIS — R778 Other specified abnormalities of plasma proteins: Secondary | ICD-10-CM | POA: Diagnosis not present

## 2019-08-02 DIAGNOSIS — G893 Neoplasm related pain (acute) (chronic): Secondary | ICD-10-CM | POA: Diagnosis not present

## 2019-08-02 DIAGNOSIS — Z515 Encounter for palliative care: Secondary | ICD-10-CM | POA: Diagnosis not present

## 2019-08-02 DIAGNOSIS — J9811 Atelectasis: Secondary | ICD-10-CM | POA: Diagnosis present

## 2019-08-02 DIAGNOSIS — R1013 Epigastric pain: Secondary | ICD-10-CM | POA: Diagnosis not present

## 2019-08-02 DIAGNOSIS — C163 Malignant neoplasm of pyloric antrum: Secondary | ICD-10-CM | POA: Diagnosis present

## 2019-08-02 DIAGNOSIS — I21A1 Myocardial infarction type 2: Secondary | ICD-10-CM | POA: Diagnosis present

## 2019-08-02 DIAGNOSIS — I739 Peripheral vascular disease, unspecified: Secondary | ICD-10-CM | POA: Diagnosis present

## 2019-08-02 DIAGNOSIS — D630 Anemia in neoplastic disease: Secondary | ICD-10-CM | POA: Diagnosis present

## 2019-08-02 DIAGNOSIS — E876 Hypokalemia: Secondary | ICD-10-CM | POA: Diagnosis present

## 2019-08-02 DIAGNOSIS — I10 Essential (primary) hypertension: Secondary | ICD-10-CM | POA: Diagnosis present

## 2019-08-02 DIAGNOSIS — C801 Malignant (primary) neoplasm, unspecified: Secondary | ICD-10-CM

## 2019-08-02 DIAGNOSIS — E43 Unspecified severe protein-calorie malnutrition: Secondary | ICD-10-CM | POA: Diagnosis present

## 2019-08-02 DIAGNOSIS — Z8619 Personal history of other infectious and parasitic diseases: Secondary | ICD-10-CM | POA: Diagnosis not present

## 2019-08-02 DIAGNOSIS — D689 Coagulation defect, unspecified: Secondary | ICD-10-CM | POA: Diagnosis present

## 2019-08-02 DIAGNOSIS — Z79899 Other long term (current) drug therapy: Secondary | ICD-10-CM | POA: Diagnosis not present

## 2019-08-02 DIAGNOSIS — I700 Atherosclerosis of aorta: Secondary | ICD-10-CM | POA: Diagnosis not present

## 2019-08-02 DIAGNOSIS — Z66 Do not resuscitate: Secondary | ICD-10-CM | POA: Diagnosis not present

## 2019-08-02 LAB — CBC
HCT: 18.3 % — ABNORMAL LOW (ref 39.0–52.0)
Hemoglobin: 5.9 g/dL — CL (ref 13.0–17.0)
MCH: 28.6 pg (ref 26.0–34.0)
MCHC: 32.2 g/dL (ref 30.0–36.0)
MCV: 88.8 fL (ref 80.0–100.0)
Platelets: 74 10*3/uL — ABNORMAL LOW (ref 150–400)
RBC: 2.06 MIL/uL — ABNORMAL LOW (ref 4.22–5.81)
RDW: 14.3 % (ref 11.5–15.5)
WBC: 13.3 10*3/uL — ABNORMAL HIGH (ref 4.0–10.5)
nRBC: 26.5 % — ABNORMAL HIGH (ref 0.0–0.2)

## 2019-08-02 LAB — COMPREHENSIVE METABOLIC PANEL
ALT: 30 U/L (ref 0–44)
AST: 65 U/L — ABNORMAL HIGH (ref 15–41)
Albumin: 3.1 g/dL — ABNORMAL LOW (ref 3.5–5.0)
Alkaline Phosphatase: 1170 U/L — ABNORMAL HIGH (ref 38–126)
Anion gap: 16 — ABNORMAL HIGH (ref 5–15)
BUN: 36 mg/dL — ABNORMAL HIGH (ref 8–23)
CO2: 23 mmol/L (ref 22–32)
Calcium: 8.7 mg/dL — ABNORMAL LOW (ref 8.9–10.3)
Chloride: 100 mmol/L (ref 98–111)
Creatinine, Ser: 0.95 mg/dL (ref 0.61–1.24)
GFR calc Af Amer: >60 mL/min (ref >60)
GFR calc non Af Amer: >60 mL/min (ref >60)
Glucose, Bld: 150 mg/dL — ABNORMAL HIGH (ref 70–99)
Potassium: 3.3 mmol/L — ABNORMAL LOW (ref 3.5–5.1)
Sodium: 139 mmol/L (ref 135–145)
Total Bilirubin: 0.7 mg/dL (ref 0.3–1.2)
Total Protein: 6.8 g/dL (ref 6.5–8.1)

## 2019-08-02 LAB — SARS CORONAVIRUS 2 BY RT PCR (HOSPITAL ORDER, PERFORMED IN ~~LOC~~ HOSPITAL LAB): SARS Coronavirus 2: NEGATIVE

## 2019-08-02 LAB — PREPARE RBC (CROSSMATCH)

## 2019-08-02 LAB — APTT: aPTT: 47 seconds — ABNORMAL HIGH (ref 24–36)

## 2019-08-02 LAB — ABO/RH: ABO/RH(D): AB POS

## 2019-08-02 LAB — TROPONIN I (HIGH SENSITIVITY)
Troponin I (High Sensitivity): 31 ng/L — ABNORMAL HIGH (ref <18)
Troponin I (High Sensitivity): 261 ng/L (ref <18)
Troponin I (High Sensitivity): 1913 ng/L (ref <18)

## 2019-08-02 LAB — PROTIME-INR
INR: 1.7 — ABNORMAL HIGH (ref 0.8–1.2)
Prothrombin Time: 19.1 seconds — ABNORMAL HIGH (ref 11.4–15.2)

## 2019-08-02 LAB — LACTIC ACID, PLASMA
Lactic Acid, Venous: 2 mmol/L (ref 0.5–1.9)
Lactic Acid, Venous: 1.2 mmol/L (ref 0.5–1.9)

## 2019-08-02 LAB — ECHOCARDIOGRAM COMPLETE
Height: 66 in
Weight: 2031.76 oz

## 2019-08-02 LAB — CBG MONITORING, ED
Glucose-Capillary: 126 mg/dL — ABNORMAL HIGH (ref 70–99)
Glucose-Capillary: 125 mg/dL — ABNORMAL HIGH (ref 70–99)

## 2019-08-02 LAB — HIV ANTIBODY (ROUTINE TESTING W REFLEX): HIV Screen 4th Generation wRfx: NONREACTIVE

## 2019-08-02 LAB — POC OCCULT BLOOD, ED: Fecal Occult Bld: NEGATIVE

## 2019-08-02 LAB — LIPASE, BLOOD: Lipase: 31 U/L (ref 11–51)

## 2019-08-02 LAB — HEMOGLOBIN AND HEMATOCRIT, BLOOD
HCT: 26.2 % — ABNORMAL LOW (ref 39.0–52.0)
Hemoglobin: 8.5 g/dL — ABNORMAL LOW (ref 13.0–17.0)

## 2019-08-02 MED ORDER — ONDANSETRON HCL 4 MG PO TABS: 4.0000 mg | ORAL_TABLET | Freq: Four times a day (QID) | ORAL | Status: DC | PRN

## 2019-08-02 MED ORDER — BISACODYL 10 MG RE SUPP
10.0000 mg | Freq: Once | RECTAL | Status: AC
  Administered 2019-08-02: 10 mg via RECTAL
  Filled 2019-08-02: qty 1

## 2019-08-02 MED ORDER — POLYETHYLENE GLYCOL 3350 17 G PO PACK
17.0000 g | PACK | Freq: Two times a day (BID) | ORAL | Status: DC
  Administered 2019-08-03 – 2019-08-04 (×2): 17 g via ORAL
  Filled 2019-08-02 (×3): qty 1

## 2019-08-02 MED ORDER — BISACODYL 5 MG PO TBEC
10.0000 mg | DELAYED_RELEASE_TABLET | Freq: Once | ORAL | Status: DC
  Filled 2019-08-02: qty 2

## 2019-08-02 MED ORDER — SODIUM CHLORIDE 0.9 % IV SOLN
10.0000 mL/h | Freq: Once | INTRAVENOUS | Status: AC
  Administered 2019-08-02: 10 mL/h via INTRAVENOUS

## 2019-08-02 MED ORDER — FENTANYL CITRATE (PF) 100 MCG/2ML IJ SOLN
50.0000 ug | Freq: Once | INTRAMUSCULAR | Status: AC
  Administered 2019-08-02: 50 ug via INTRAVENOUS
  Filled 2019-08-02: qty 2

## 2019-08-02 MED ORDER — POTASSIUM CHLORIDE CRYS ER 20 MEQ PO TBCR
40.0000 meq | EXTENDED_RELEASE_TABLET | ORAL | Status: AC
  Administered 2019-08-02: 40 meq via ORAL
  Filled 2019-08-02: qty 2

## 2019-08-02 MED ORDER — ONDANSETRON HCL 4 MG/2ML IJ SOLN
4.0000 mg | Freq: Four times a day (QID) | INTRAMUSCULAR | Status: DC | PRN
  Administered 2019-08-02: 4 mg via INTRAVENOUS
  Filled 2019-08-02: qty 2

## 2019-08-02 MED ORDER — HYDROMORPHONE HCL 1 MG/ML IJ SOLN
1.0000 mg | Freq: Four times a day (QID) | INTRAMUSCULAR | Status: DC | PRN
  Administered 2019-08-03 – 2019-08-04 (×2): 1 mg via INTRAVENOUS
  Filled 2019-08-02 (×2): qty 1

## 2019-08-02 MED ORDER — LORAZEPAM 2 MG/ML IJ SOLN
1.0000 mg | Freq: Once | INTRAMUSCULAR | Status: AC
  Administered 2019-08-02: 1 mg via INTRAVENOUS
  Filled 2019-08-02: qty 1

## 2019-08-02 MED ORDER — SODIUM CHLORIDE 0.9 % IV SOLN
80.0000 mg | Freq: Two times a day (BID) | INTRAVENOUS | Status: DC
  Administered 2019-08-02 – 2019-08-04 (×4): 80 mg via INTRAVENOUS
  Filled 2019-08-02 (×7): qty 80

## 2019-08-02 MED ORDER — SODIUM CHLORIDE 0.9% FLUSH
3.0000 mL | Freq: Two times a day (BID) | INTRAVENOUS | Status: DC
  Administered 2019-08-02 – 2019-08-04 (×3): 3 mL via INTRAVENOUS

## 2019-08-02 MED ORDER — SODIUM CHLORIDE 0.9 % IV BOLUS (SEPSIS)
1000.0000 mL | Freq: Once | INTRAVENOUS | Status: AC
  Administered 2019-08-02: 1000 mL via INTRAVENOUS

## 2019-08-02 MED ORDER — IOHEXOL 350 MG/ML SOLN
100.0000 mL | Freq: Once | INTRAVENOUS | Status: AC | PRN
  Administered 2019-08-02: 100 mL via INTRAVENOUS

## 2019-08-02 MED ORDER — SODIUM CHLORIDE 0.9% IV SOLUTION: Freq: Once | INTRAVENOUS | Status: AC

## 2019-08-02 MED ORDER — PANTOPRAZOLE SODIUM 40 MG IV SOLR
40.0000 mg | Freq: Two times a day (BID) | INTRAVENOUS | Status: DC
  Administered 2019-08-02: 40 mg via INTRAVENOUS
  Filled 2019-08-02: qty 40

## 2019-08-02 MED ORDER — ONDANSETRON HCL 4 MG/2ML IJ SOLN
4.0000 mg | Freq: Once | INTRAMUSCULAR | Status: AC
  Administered 2019-08-02: 4 mg via INTRAVENOUS
  Filled 2019-08-02: qty 2

## 2019-08-02 MED ORDER — ALBUTEROL SULFATE (2.5 MG/3ML) 0.083% IN NEBU: 2.5000 mg | INHALATION_SOLUTION | RESPIRATORY_TRACT | Status: DC | PRN

## 2019-08-02 MED ORDER — POLYETHYLENE GLYCOL 3350 17 G PO PACK: 17.0000 g | PACK | Freq: Every day | ORAL | Status: DC | PRN

## 2019-08-02 MED ORDER — SODIUM CHLORIDE 0.9 % IV SOLN: INTRAVENOUS | Status: AC

## 2019-08-02 MED ORDER — DEXAMETHASONE SODIUM PHOSPHATE 10 MG/ML IJ SOLN
10.0000 mg | Freq: Four times a day (QID) | INTRAMUSCULAR | Status: DC
  Administered 2019-08-02 – 2019-08-03 (×2): 10 mg via INTRAVENOUS
  Filled 2019-08-02 (×2): qty 1

## 2019-08-02 MED ORDER — HYDROMORPHONE HCL 1 MG/ML IJ SOLN
1.0000 mg | Freq: Once | INTRAMUSCULAR | Status: AC
  Administered 2019-08-02: 1 mg via INTRAVENOUS
  Filled 2019-08-02: qty 1

## 2019-08-02 MED ORDER — BISACODYL 5 MG PO TBEC: 5.0000 mg | DELAYED_RELEASE_TABLET | Freq: Every day | ORAL | Status: DC | PRN

## 2019-08-02 MED ORDER — DEXAMETHASONE SODIUM PHOSPHATE 10 MG/ML IJ SOLN
40.0000 mg | INTRAMUSCULAR | Status: AC
  Administered 2019-08-02: 40 mg via INTRAVENOUS
  Filled 2019-08-02: qty 4

## 2019-08-02 MED ORDER — GADOBUTROL 1 MMOL/ML IV SOLN
6.0000 mL | Freq: Once | INTRAVENOUS | Status: AC | PRN
  Administered 2019-08-02: 6 mL via INTRAVENOUS

## 2019-08-02 NOTE — ED Notes (Signed)
Pt taken to CT.

## 2019-08-02 NOTE — Consult Note (Signed)
Radiation Oncology         [phone] ________________________________  Name: Dylan Kaufman MRN: [id number]  Date: 07/19/2019  DOB: April 17, 1950  QM:[id number], Theophilus Kinds, MD  No ref. provider found     REFERRING PHYSICIAN: No ref. provider found   DIAGNOSIS: The primary encounter diagnosis was Symptomatic anemia. Diagnoses of Malignancy ([place]), Other ascites, Ascites, and Epigastric pain were also pertinent to this visit.   HISTORY OF PRESENT ILLNESS::Dylan Kaufman is a 69 y.o. male who is seen for an initial consultation visit regarding the patient's diagnosis of apparent metastatic disease.  The patient was seen for back pain on 616 in the ER and x-rays did not show a definitive cause of this.  The patient's pain persisted and worsened and he presented to the emergency room with ongoing complaints including constipation, bladder incontinence, and lower extremity weakness.  Imaging today appears to demonstrate diffuse multifocal metastatic disease with numerous bony metastases.  Notable is an area of apparent epidural involvement at the T6-T10 level with mild cord compression.  Given these findings I have been asked to see the patient for consideration of possible radiation treatment to this area.    PREVIOUS RADIATION THERAPY: No   PAST MEDICAL HISTORY:  has a past medical history of Hepatitis C infection, Hypertension, and PAD (peripheral artery disease) ([place]).     PAST SURGICAL HISTORY: Past Surgical History:  Procedure Laterality Date  . LIPOMA EXCISION  06/2016   in left groin--- ED since then     FAMILY HISTORY: family history includes HIV/AIDS in his brother.   SOCIAL HISTORY:  reports that he has been smoking. He started smoking about 56 years ago. He has never used smokeless tobacco. He reports current alcohol use.   ALLERGIES: Patient has no known allergies.   MEDICATIONS:  Current Facility-Administered Medications  Medication Dose Route Frequency Provider Last Rate  Last Admin  . 0.9 %  sodium chloride infusion (Manually program via Guardrails IV Fluids)   Intravenous Once Kary Kos, MD      . 0.9 %  sodium chloride infusion   Intravenous Continuous Clance Boll, MD   Stopped at 08/02/19 1838  . albuterol (PROVENTIL) (2.5 MG/3ML) 0.083% nebulizer solution 2.5 mg  2.5 mg Nebulization Q2H PRN Clance Boll, MD      . bisacodyl (DULCOLAX) EC tablet 10 mg  10 mg Oral Once Gribbin, Sarah J, PA-C      . bisacodyl (DULCOLAX) EC tablet 5 mg  5 mg Oral Daily PRN Vena Rua, PA-C      . dexamethasone (DECADRON) injection 10 mg  10 mg Intravenous Q6H Kary Kos, MD      . HYDROmorphone (DILAUDID) injection 1 mg  1 mg Intravenous Q6H PRN Matcha, Anupama, MD      . ondansetron (ZOFRAN) tablet 4 mg  4 mg Oral Q6H PRN Clance Boll, MD       Or  . ondansetron [facility]) injection 4 mg  4 mg Intravenous Q6H PRN Clance Boll, MD   4 mg at 08/02/19 1445  . pantoprazole (PROTONIX) 80 mg in sodium chloride 0.9 % 100 mL IVPB  80 mg Intravenous Q12H Kary Kos, MD      . polyethylene glycol (MIRALAX / GLYCOLAX) packet 17 g  17 g Oral BID Vena Rua, PA-C      . sodium chloride flush (NS) 0.9 % injection 3 mL  3 mL Intravenous Q12H Clance Boll, MD  REVIEW OF SYSTEMS:  A 15 point review of systems is documented in the electronic medical record. This was obtained by the nursing staff. However, I reviewed this with the patient to discuss relevant findings and make appropriate changes.  Pertinent items are noted in HPI.    PHYSICAL EXAM:  height is _0  (1.676 m) and weight is 126 lb 15.8 oz (57.6 kg). His oral temperature is 98.9 F (37.2 C). His blood pressure is 148/92 (abnormal) and his pulse is 108 (abnormal). His respiration is 24 (abnormal) and oxygen saturation is 99%.   ECOG = 3  0 - Asymptomatic (Fully active, able to carry on all predisease activities without restriction)  1 - Symptomatic but completely ambulatory  (Restricted in physically strenuous activity but ambulatory and able to carry out work of a light or sedentary nature. For example, light housework, office work)  2 - Symptomatic, <50% in bed during the day (Ambulatory and capable of all self care but unable to carry out any work activities. Up and about more than 50% of waking hours)  3 - Symptomatic, >50% in bed, but not bedbound (Capable of only limited self-care, confined to bed or chair 50% or more of waking hours)  4 - Bedbound (Completely disabled. Cannot carry on any self-care. Totally confined to bed or chair)  5 - Death   Eustace Pen MM, Creech RH, Tormey DC, et al. [phone]). "Toxicity and response criteria of the [facility]". [place] Oncol. 5 (6): 649-55  Alert and oriented x3, accompanied by his wife Upper extremity strength appears to be 5 out of 5 versus 4+ out of 5 bilaterally.  The patient was alert during neuro exam and has flaccid paralysis of his lower extremities bilaterally, 0 out of 5.   LABORATORY DATA:  Lab Results  Component Value Date   WBC 13.3 (H) 08/02/2019   HGB 8.5 (L) 08/02/2019   HCT 26.2 (L) 08/02/2019   MCV 88.8 08/02/2019   PLT 74 (L) 08/02/2019   Lab Results  Component Value Date   NA 139 08/02/2019   K 3.3 (L) 08/02/2019   CL 100 08/02/2019   CO2 23 08/02/2019   Lab Results  Component Value Date   ALT 30 08/02/2019   AST 65 (H) 08/02/2019   ALKPHOS 1,170 (H) 08/02/2019   BILITOT 0.7 08/02/2019      RADIOGRAPHY: DG Chest 2 View  Result Date: 07/21/2019 CLINICAL DATA:  Left back pain EXAM: CHEST - 2 VIEW COMPARISON:  None. FINDINGS: Heart and mediastinal contours are within normal limits. Left basilar linear densities, likely scarring. No confluent opacities otherwise. No effusions. No acute bony abnormality. Old healed left rib fractures. IMPRESSION: No active cardiopulmonary disease. Electronically Signed   By: Rolm Baptise M.D.   On: 07/21/2019 21:30   DG Thoracic  Spine 2 View  Result Date: 07/21/2019 CLINICAL DATA:  Left side back pain EXAM: THORACIC SPINE 2 VIEWS COMPARISON:  None. FINDINGS: No fracture or malalignment.  No focal bone lesion. IMPRESSION: No acute bony abnormality. Electronically Signed   By: Rolm Baptise M.D.   On: 07/21/2019 21:29   DG Lumbar Spine Complete  Result Date: 07/21/2019 CLINICAL DATA:  Left back pain EXAM: LUMBAR SPINE - COMPLETE 4+ VIEW COMPARISON:  None. FINDINGS: Normal alignment. No fracture. Disc spaces maintained. Aortic atherosclerosis. No aneurysm. IMPRESSION: No acute bony abnormality. Electronically Signed   By: Rolm Baptise M.D.   On: 07/21/2019 21:30   CT Angio Chest PE  W and/or Wo Contrast  Result Date: 08/02/2019 CLINICAL DATA:  Chest pain and shortness of breath.  Constipation EXAM: CT ANGIOGRAPHY CHEST CT ABDOMEN AND PELVIS WITH CONTRAST TECHNIQUE: Multidetector CT imaging of the chest was performed using the standard protocol during bolus administration of intravenous contrast. Multiplanar CT image reconstructions and MIPs were obtained to evaluate the vascular anatomy. Multidetector CT imaging of the abdomen and pelvis was performed using the standard protocol during bolus administration of intravenous contrast. CONTRAST:  163m OMNIPAQUE IOHEXOL 350 MG/ML SOLN COMPARISON:  None. FINDINGS: CTA CHEST FINDINGS Cardiovascular: Normal heart size. No pericardial effusion. There may be hypertrophy of the left ventricle. Heavily calcified aorta, great vessels, and coronary arteries. 40% narrowing of the brachiocephalic artery on coronal reformats. Assessment of the left common carotid atherosclerotic narrowing is limited by streak artifact. That no pulmonary artery filling defect. Mediastinum/Nodes: Negative for adenopathy or mass Lungs/Pleura: Airway plugging in the left lower lobe. Bilateral lower lobe atelectasis and small volume left pleural effusion. Musculoskeletal: Patchy sclerosis in the sternum and throughout the  spine primarily worrisome for metastatic disease. Remote bilateral rib fractures. T7 superior endplate deformity without visible acute fracture line. Review of the MIP images confirms the above findings. CT ABDOMEN and PELVIS FINDINGS Hepatobiliary: Noted history of hepatitis-C. No overt cirrhotic changes. Tiny cystic density in the central liver.No evidence of biliary obstruction or stone. Pancreas: Unremarkable. Spleen: Unremarkable. Adrenals/Urinary Tract: Negative adrenals. No hydronephrosis or stone. Right upper pole cortical scarring. Distended urinary bladder without focal abnormality. Stomach/Bowel: No obstruction or visible inflammation. Prominent enhancement and redundant appearance to the gastric antral mucosa. Moderate stool retention. Vascular/Lymphatic: No acute vascular abnormality. Heavily calcified aorta and iliacs with advanced bilateral iliac and common femoral/superficial femoral artery narrowings. No mass or adenopathy. Reproductive:Probable TURP defect. Other: Large volume ascites which does not appear equally distributed throughout the abdomen. There may be some peritoneal thickening and smooth linear enhancement, no discrete peritoneal nodule. Adenopathy in the gastrohepatic ligament measuring up to 13 mm short axis. Musculoskeletal: Patchy sclerosis in the lumbar spine. There are T12, L1, L2, and L3 superior endplate deformities, age-indeterminate. Review of the MIP images confirms the above findings. IMPRESSION: Abdominal CT: 1. Numerous sclerotic foci in the spine worrisome for metastatic disease. Prominent intrathecal enhancement, suggest MR follow-up if there is myelopathy or polyneuropathy. There are multiple superior endplate fractures, age-indeterminate. 2. A gastric primary is considered given the prominent antral mucosa and adenopathy in the gastrohepatic ligament. 3. Large volume ascites. No discrete peritoneal nodule, but there is some loculation and probable peritoneal  thickening, suggest paracentesis with cytology. 4. Severe atherosclerosis. Chest CTA: 1. Negative for pulmonary embolism. 2. Bilateral lower lobe atelectasis with bronchial plugging and trace effusion on the left. 3. Severe atherosclerosis. Electronically Signed   By: JMonte FantasiaM.D.   On: 08/02/2019 04:43   MR BRAIN W WO CONTRAST  Result Date: 08/02/2019 CLINICAL DATA:  69year old male with evidence of spinal metastatic disease on CT Chest, Abdomen, and Pelvis today. EXAM: MRI HEAD WITHOUT AND WITH CONTRAST TECHNIQUE: Multiplanar, multiecho pulse sequences of the brain and surrounding structures were obtained without and with intravenous contrast. CONTRAST:  680mGADAVIST GADOBUTROL 1 MMOL/ML IV SOLN COMPARISON:  No prior brain imaging. FINDINGS: Brain: There is subtle abnormal diffusion in white matter near the left splenium of the corpus callosum (series 7 image 42). Mild T2 and FLAIR hyperintensity is associated, with scattered additional patchy white matter T2 and FLAIR hyperintensity elsewhere. Similar T2 and FLAIR hyperintensity in the  left basal ganglia compatible with chronic lacunar infarct. No other abnormal diffusion. No cortical encephalomalacia identified. There is a chronic microhemorrhage in the left thalamus. Questionable similar chronic microhemorrhage in the right deep cerebellar nuclei. Motion degraded postcontrast imaging throughout the brain. No definite abnormal parenchymal enhancement. No midline shift, mass effect, ventriculomegaly, extra-axial collection or acute intracranial hemorrhage. Cervicomedullary junction and pituitary are within normal limits. Vascular: Major intracranial vascular flow voids are preserved. Postcontrast images are motion degraded but the superior sagittal sinus, transverse and sigmoid sinuses appear to be grossly patent and enhancing. Skull and upper cervical spine: Abnormal marrow signal in the occipital condyles and throughout the visible cervical spine.  Superimposed discrete and expansile bone mass at the left lower temporal bone (series 9, image 18, series 5, image 52. Associated mild left mastoid effusion. No other destructive skull lesion identified. Sinuses/Orbits: Negative. Other: Right mastoids remain clear.  No scalp soft tissue lesion. IMPRESSION: 1. Positive for bone metastases to the skull base and visible cervical spine, including a mildly expansile lesion of the left temporal bone. Mild associated left mastoid effusion. 2. Substantially degraded postcontrast images of the brain with no obvious brain metastasis. A repeat staging Brain MRI when the patient can better cooperate would be valuable. 3. Evidence of small vessel disease with possible recent white matter ischemia near the left splenium. Electronically Signed   By: Genevie Ann M.D.   On: 08/02/2019 14:22   MR THORACIC SPINE W WO CONTRAST  Result Date: 08/02/2019 CLINICAL DATA:  Back pain. Abnormal spine on CT. Chronic hepatitis C infection. EXAM: MRI THORACIC WITHOUT AND WITH CONTRAST TECHNIQUE: Multiplanar and multiecho pulse sequences of the thoracic spine were obtained without and with intravenous contrast. CONTRAST:  46m GADAVIST GADOBUTROL 1 MMOL/ML IV SOLN COMPARISON:  CT chest abdomen pelvis earlier today. FINDINGS: MRI THORACIC SPINE FINDINGS Alignment:  Normal Vertebrae: Diffusely abnormal bone marrow throughout the thoracic spine with diffuse low signal intensity suggesting a diffuse infiltrative process. Scattered sclerotic lesions are present at several levels on CT. Mild compression fracture of T5 with bone marrow edema. Mild compression fractures of T7, T8, T9, T10 with bone marrow edema. Mild compression fracture T12 with mild bone marrow edema. There is heterogeneous enhancement of the bone marrow involving these fractures. Cord: There is spinal stenosis due to epidural soft tissue thickening from T6 through T10. This is causing cord flattening and mild cord compression. No cord  signal abnormality is identified. Paraspinal and other soft tissues: Epidural soft tissue thickening and enhancement from T6 through T10. There appears to be infiltration of the posterior epidural fat with enhancement. This is predominately posterior but appears circumferential. This is contributing to moderate spinal stenosis Disc levels: Negative for disc protrusion.  Negative for disc space infection. IMPRESSION: Diffusely abnormal bone marrow throughout the thoracic spine suggesting an infiltrative process such as lymphoma, metastatic disease, or multiple myeloma. Multiple pathologic fractures that appear recent involving T5, T7, T9, T10, T12 Extensive epidural thickening and enhancement in the midthoracic spine causing compression of the spinal cord. 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 These results were called by telephone at the time of interpretation on 08/02/2019 at 2:48 pm to provider Matcha, who verbally acknowledged these results. Electronically Signed   By: CFranchot GalloM.D.   On: 08/02/2019 14:51   MR Lumbar Spine W Wo Contrast  Result Date: 08/02/2019 CLINICAL DATA:  Back pain.  Abnormal CT of the spine. EXAM: MRI LUMBAR SPINE WITHOUT AND WITH CONTRAST TECHNIQUE: Multiplanar and multiecho pulse sequences of  the lumbar spine were obtained without and with intravenous contrast. CONTRAST:  46m GADAVIST GADOBUTROL 1 MMOL/ML IV SOLN COMPARISON:  CT  chest abdomen pelvis 08/02/2019 FINDINGS: Segmentation:  Normal Alignment:  Normal Vertebrae: Diffusely abnormal bone marrow which is markedly low signal on T1. Mild superior and inferior endplate fractures are present throughout the lumbar spine. There is mild edema involving the T12 and L2 fracture which may be recent. No focal mass lesion is identified. Conus medullaris and cauda equina: Conus extends to the L1-2 level. Conus and cauda equina appear normal. Paraspinal and other soft tissues: Moderate ascites. Liver and spleen are diffusely low signal. Patient has  a history of chronic hepatitis C infection. No paraspinous adenopathy. Disc levels: L1-2: Negative L2-3: Negative L3-4: Small central disc protrusion.  Negative for stenosis L4-5: Small central disc protrusion.  Negative for stenosis L5-S1: Negative IMPRESSION: Diffusely abnormal bone marrow. The patient has severe anemia. No focal lesion identified. Possible lymphoma or myeloproliferative disorder. Also consider widespread metastatic disease or myeloma. Consider bone marrow biopsy. Multiple mild compression fractures throughout the lumbar spine. There is mild edema at T12 and L2 suggesting recent fractures. These are mild. Moderate ascites. Diffusely low signal in the liver and spleen which may reflect hemochromatosis. Electronically Signed   By: CFranchot GalloM.D.   On: 08/02/2019 14:25   CT ABDOMEN PELVIS W CONTRAST  Result Date: 08/02/2019 CLINICAL DATA:  Chest pain and shortness of breath.  Constipation EXAM: CT ANGIOGRAPHY CHEST CT ABDOMEN AND PELVIS WITH CONTRAST TECHNIQUE: Multidetector CT imaging of the chest was performed using the standard protocol during bolus administration of intravenous contrast. Multiplanar CT image reconstructions and MIPs were obtained to evaluate the vascular anatomy. Multidetector CT imaging of the abdomen and pelvis was performed using the standard protocol during bolus administration of intravenous contrast. CONTRAST:  1095mOMNIPAQUE IOHEXOL 350 MG/ML SOLN COMPARISON:  None. FINDINGS: CTA CHEST FINDINGS Cardiovascular: Normal heart size. No pericardial effusion. There may be hypertrophy of the left ventricle. Heavily calcified aorta, great vessels, and coronary arteries. 40% narrowing of the brachiocephalic artery on coronal reformats. Assessment of the left common carotid atherosclerotic narrowing is limited by streak artifact. That no pulmonary artery filling defect. Mediastinum/Nodes: Negative for adenopathy or mass Lungs/Pleura: Airway plugging in the left lower lobe.  Bilateral lower lobe atelectasis and small volume left pleural effusion. Musculoskeletal: Patchy sclerosis in the sternum and throughout the spine primarily worrisome for metastatic disease. Remote bilateral rib fractures. T7 superior endplate deformity without visible acute fracture line. Review of the MIP images confirms the above findings. CT ABDOMEN and PELVIS FINDINGS Hepatobiliary: Noted history of hepatitis-C. No overt cirrhotic changes. Tiny cystic density in the central liver.No evidence of biliary obstruction or stone. Pancreas: Unremarkable. Spleen: Unremarkable. Adrenals/Urinary Tract: Negative adrenals. No hydronephrosis or stone. Right upper pole cortical scarring. Distended urinary bladder without focal abnormality. Stomach/Bowel: No obstruction or visible inflammation. Prominent enhancement and redundant appearance to the gastric antral mucosa. Moderate stool retention. Vascular/Lymphatic: No acute vascular abnormality. Heavily calcified aorta and iliacs with advanced bilateral iliac and common femoral/superficial femoral artery narrowings. No mass or adenopathy. Reproductive:Probable TURP defect. Other: Large volume ascites which does not appear equally distributed throughout the abdomen. There may be some peritoneal thickening and smooth linear enhancement, no discrete peritoneal nodule. Adenopathy in the gastrohepatic ligament measuring up to 13 mm short axis. Musculoskeletal: Patchy sclerosis in the lumbar spine. There are T12, L1, L2, and L3 superior endplate deformities, age-indeterminate. Review of the MIP images confirms the  above findings. IMPRESSION: Abdominal CT: 1. Numerous sclerotic foci in the spine worrisome for metastatic disease. Prominent intrathecal enhancement, suggest MR follow-up if there is myelopathy or polyneuropathy. There are multiple superior endplate fractures, age-indeterminate. 2. A gastric primary is considered given the prominent antral mucosa and adenopathy in the  gastrohepatic ligament. 3. Large volume ascites. No discrete peritoneal nodule, but there is some loculation and probable peritoneal thickening, suggest paracentesis with cytology. 4. Severe atherosclerosis. Chest CTA: 1. Negative for pulmonary embolism. 2. Bilateral lower lobe atelectasis with bronchial plugging and trace effusion on the left. 3. Severe atherosclerosis. Electronically Signed   By: Monte Fantasia M.D.   On: 08/02/2019 04:43   ECHOCARDIOGRAM COMPLETE  Result Date: 08/02/2019    ECHOCARDIOGRAM REPORT   Patient Name:   [facility] Date of Exam: 08/02/2019 Medical Rec #:  [id number]    Height:       66.0 in Accession #:    [id number]   Weight:       127.0 lb Date of Birth:  Aug 08, 1950   BSA:          1.649 m Patient Age:    26 years     BP:           128/72 mmHg Patient Gender: M            HR:           104 bpm. Exam Location:  Inpatient Procedure: 2D Echo, Cardiac Doppler and Color Doppler Indications:    122-I22.9 Subsequent ST elevation (STEM) and non-ST elevation                 (NSTEMI) myocardial infarction  History:        Patient has no prior history of Echocardiogram examinations.                 Signs/Symptoms:Chest Pain and Dyspnea; Risk Factors:Current                 Smoker and Hypertension. HEP C.  Sonographer:    Roseanna Rainbow RDCS Referring Phys: [id number] [facility] A THOM[facility]  Sonographer Comments: Technically difficult study due to poor echo windows. Patient in pain and in supine position. Patient asked me to stop pressing on him in apical region. IMPRESSIONS  1. Left ventricular There is mild left ventricular hypertrophy. Apical window is forshortened. The LVEF is approximately 50 to 55% with apical hypokinesis.  2. Right ventricular systolic function is normal. The right ventricular size is normal.  3. The mitral valve is normal in structure. Trivial mitral valve regurgitation.  4. The aortic valve is tricuspid. Aortic valve regurgitation is not visualized. Mild aortic valve sclerosis  is present, with no evidence of aortic valve stenosis.  5. The inferior vena cava is normal in size with greater than 50% respiratory variability, suggesting right atrial pressure of 3 mmHg. FINDINGS  Left Ventricle: Left ventricular ejection fraction, by estimation, is 50 to 55%. The left ventricle has low normal function. The left ventricle demonstrates regional wall motion abnormalities. The left ventricular internal cavity size was normal in size. There is mild left ventricular hypertrophy. Left ventricular diastolic parameters are consistent with Grade I diastolic dysfunction (impaired relaxation). Right Ventricle: The right ventricular size is normal. Right vetricular wall thickness was not assessed. Right ventricular systolic function is normal. Left Atrium: Left atrial size was normal in size. Right Atrium: Right atrial size was normal in size. Pericardium: There is no evidence of pericardial effusion. Mitral Valve: The  mitral valve is normal in structure. Trivial mitral valve regurgitation. Tricuspid Valve: The tricuspid valve is normal in structure. Tricuspid valve regurgitation is trivial. Aortic Valve: The aortic valve is tricuspid. Aortic valve regurgitation is not visualized. Mild aortic valve sclerosis is present, with no evidence of aortic valve stenosis. Pulmonic Valve: The pulmonic valve was not well visualized. Pulmonic valve regurgitation is trivial. Aorta: The aortic root is normal in size and structure. Venous: The inferior vena cava is normal in size with greater than 50% respiratory variability, suggesting right atrial pressure of 3 mmHg. IAS/Shunts: No atrial level shunt detected by color flow Doppler.  LEFT VENTRICLE PLAX 2D LVIDd:         3.80 cm     Diastology LVIDs:         2.70 cm     LV e' lateral:   8.49 cm/s LV PW:         1.20 cm     LV E/e' lateral: 6.6 LV IVS:        1.10 cm     LV e' medial:    5.77 cm/s LVOT diam:     2.00 cm     LV E/e' medial:  9.7 LV SV:         62 LV SV  Index:   38 LVOT Area:     3.14 cm  LV Volumes (MOD) LV vol d, MOD A2C: 67.0 ml LV vol d, MOD A4C: 71.4 ml LV vol s, MOD A2C: 28.6 ml LV vol s, MOD A4C: 38.2 ml LV SV MOD A2C:     38.4 ml LV SV MOD A4C:     71.4 ml LV SV MOD BP:      35.7 ml RIGHT VENTRICLE             IVC RV S prime:     18.20 cm/s  IVC diam: 1.50 cm TAPSE (M-mode): 2.3 cm LEFT ATRIUM             Index       RIGHT ATRIUM          Index LA diam:        3.50 cm 2.12 cm/m  RA Area:     7.89 cm LA Vol (A2C):   28.1 ml 17.04 ml/m RA Volume:   14.00 ml 8.49 ml/m LA Vol (A4C):   16.2 ml 9.83 ml/m LA Biplane Vol: 22.3 ml 13.53 ml/m  AORTIC VALVE LVOT Vmax:   105.00 cm/s LVOT Vmean:  67.000 cm/s LVOT VTI:    0.197 m  AORTA Ao Root diam: 3.00 cm Ao Asc diam:  3.20 cm MITRAL VALVE MV Area (PHT): 4.15 cm    SHUNTS MV Decel Time: 183 msec    Systemic VTI:  0.20 m MV E velocity: 55.70 cm/s  Systemic Diam: 2.00 cm MV A velocity: 99.00 cm/s MV E/A ratio:  0.56 Dorris Carnes MD Electronically signed by Dorris Carnes MD Signature Date/Time: 08/02/2019/2:54:24 PM    Final        IMPRESSION/ PLAN:  The patient has an apparent finding of metastatic cancer with unknown primary at this time.  Suspicious findings were seen within the stomach, possible gastric cancer primary, and the patient is proceeding with upper endoscopy tomorrow which appears reasonable.  The patient and his wife are very eager to see the results of this.  The patient's lower extremity weakness is extremely pronounced at this time, no movement voluntarily on exam.  Prior documentation suggested possible  higher level of strength, but the exam was striking tonight and is consistent with the patient's description of profound weakness since last Thursday.  Constipation and urinary incontinence have also been present since last Thursday according to him.  No improvement with initiation of steroids today.  Given these findings, I do not believe that there is a significant chance of regaining  substantial strength from radiation treatment to the thoracic spine.  Typical improvement for strength is most often present with weakness emerging within the prior 24 to 48 hours.  Unfortunately, we are outside this window and the degree of weakness is particularly profound.  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  I discussed with the patient and his wife the potential role for radiation treatment, particularly to the thoracic spine, for palliation given other complaints such as pain as well.  I discussed the possibility of improvement in symptoms, including pain and we also had a frank discussion about the likely lack of a significant chance for substantial improvement in strength given the above.  They understood this.  It does not appear that emergent radiation treatment at this time would be helpful.  The patient and his wife also voiced their strong preference to know more information about what we are dealing with prior to making a final plan.  We discussed that sometimes there is a role for emergent radiation treatment without a complete picture if the cost of waiting for additional information such as a biopsy may allow weakness to become more profound or permanent, but this does not appear to be the case based on the clinical presentation right now.  Again, this is also in keeping with the patient and his wife's wishes to really move in a stepwise fashion, especially given difficulty in finding a diagnosis with prior visits to the ER without a clear understanding or plan based on the lack of clear results at that time.  We left it this evening that palliative radiation treatment would be a potential consideration, potentially 2 weeks duration, targeting the T-spine and potentially other areas based on further  work-up.  We will touch base with them tomorrow.  Ideally, we would have further pathologic information before embarking on treatment but again this can be addressed on a case-by-case basis.  I appreciate transfer to [facility] which gave [place] an opportunity for emergent treatment if the clinical situation dictated.  I believe that a palliative care consult could also be very beneficial to the patient to help clarify goals of treatment.  Any radiation treatment will certainly need to fit in well with the patient's overall wishes in terms of how he would like to approach treatment.      ________________________________   Jodelle Gross, MD, PhD   **Disclaimer: This note was dictated with voice recognition software. Similar sounding words can inadvertently be transcribed and this note may contain transcription errors which may not have been corrected upon publication of note.**

## 2019-08-02 NOTE — ED Notes (Signed)
MD Matcha, made aware of 1,913 trop.

## 2019-08-02 NOTE — ED Notes (Signed)
PT blood bank armband was not on upon return from MRI. Re-screen per Blood Bank protocol.

## 2019-08-02 NOTE — H&P (Signed)
History and Physical    Dylan Kaufman YQI:[id number] DOB: 1950-05-20 DOA: 07/28/2019  PCP: Venia Carbon, MD  Patient coming from: home  I have personally briefly reviewed patient's old medical records in [place]  Chief Complaint: sob,abdominal pain, chest pain, right back pain   HPI: Dylan Kaufman is a 69 y.o. male with medical history significant of  hypertension, previous hepatitis C infection s/p Harvoni who presents to the ED with several weeks of multiple complaints, sob,abdominal pain, chest pain, right back pain. Patient was seen in ed for complaint of back pain on6/16 at that time xray of chest and back was unremarkable and patient was discharged home. Patient return to ed due to progressive pain. He as also has evaluation as outpatient that is ongoing and thus far has been unrevealing.  On ros patient states he has n/v/ and constipation, has not has a bowel movement in 3-4 days. He also notes difficulty with passing his urine. He also noted weakness in lower extremities.  He notes no sob, has had cough but this has resolved. He denies dark stools, nose bleeds, or coffee ground emesis. He has not had palpitations or near syncope and notes no falls.    ED Course: Temp 99, BP 122/69, HR 111, rr16, sat 100% on ra  Labs: Lipase 31  NA 139, K 3.3, cr 0.95, AST 65, alkphos 1170 Wbc 13.3,hgb5.9 drp from 11.2,plt 75 inr 1.7 Lactic 2.0 Ce:31 TX  EKG Nsr, diffuse st seg depression Zofran, fenatnyl, 1L NS x2  CTPA Chest CTA:  1. Negative for pulmonary embolism. 2. Bilateral lower lobe atelectasis with bronchial plugging and trace effusion on the left. 3. Severe atherosclerosis.  CT abd/pelvis  IMPRESSION: Abdominal CT:  1. Numerous sclerotic foci in the spine worrisome for metastatic disease. Prominent intrathecal enhancement, suggest MR follow-up if there is myelopathy or polyneuropathy. There are multiple superior endplate fractures, age-indeterminate. 2. A  gastric primary is considered given the prominent antral mucosa and adenopathy in the gastrohepatic ligament. 3. Large volume ascites. No discrete peritoneal nodule, but there is some loculation and probable peritoneal thickening, suggest paracentesis with cytology. 4. Severe atherosclerosis. Review of Systems: As per HPI otherwise 10 point review of systems negative.   Past Medical History:  Diagnosis Date  . Hepatitis C infection    treated with harvoni  . Hypertension   . PAD (peripheral artery disease) ([place])     Past Surgical History:  Procedure Laterality Date  . LIPOMA EXCISION  06/2016   in left groin--- ED since then     reports that he has been smoking. He started smoking about 56 years ago. He has never used smokeless tobacco. He reports current alcohol use. No history on file for drug use.  No Known Allergies  Family History  Problem Relation Age of Onset  . HIV/AIDS Brother    Prior to Admission medications   Medication Sig Start Date End Date Taking? Authorizing Provider  amLODipine (NORVASC) 10 MG tablet Take 10 mg by mouth daily.   Yes [provider]  cilostazol (PLETAL) 50 MG tablet Take 50 mg by mouth 2 (two) times daily.   Yes [provider]  ferrous sulfate 325 (65 FE) MG tablet Take 325 mg by mouth daily with breakfast.   Yes [provider]  Multiple Vitamins-Minerals (CENTRUM SILVER 50+MEN PO) Take 1 tablet by mouth daily.   Yes [provider]    Physical Exam: Vitals:   08/02/19 0500 08/02/19 0515 08/02/19  0530 08/02/19 0546  BP: 137/70 134/75 138/76 128/72  Pulse: (!) 112 (!) 115 (!) 115 (!) 114  Resp:  (!) 36    Temp:      TempSrc:      SpO2: 96% 96% 95% 97%  Weight:      Height:        Constitutional: NAD, calm, comfortable Vitals:   08/02/19 0500 08/02/19 0515 08/02/19 0530 08/02/19 0546  BP: 137/70 134/75 138/76 128/72  Pulse: (!) 112 (!) 115 (!) 115 (!) 114  Resp:  (!) 36    Temp:        TempSrc:      SpO2: 96% 96% 95% 97%  Weight:      Height:       Eyes: PERRL, lids and conjunctivae normal ENMT: Mucous membranes are moist. Posterior pharynx clear of any exudate or lesions.Normal dentition.  Neck: normal, supple, no masses, no thyromegaly Respiratory: clear to auscultation bilaterally, no wheezing, no crackles. Normal respiratory effort. No accessory muscle use.  Cardiovascular: Regular rate and rhythm, no murmurs / rubs / gallops. No extremity edema. 2+ pedal pulses. No carotid bruits.  Abdomen: +tenderness, distended. +Bowel sounds positive.  Musculoskeletal: no clubbing / cyanosis. No joint deformity upper and lower extremities. Good ROM, no contractures. Normal muscle tone.  Skin: no rashes, lesions, ulcers. No induration Neurologic: CN 2-12 grossly intact. Sensation intact,  Strength 5/5 in upper extremity, 3+/5 in lower extremity.  Psychiatric: Normal judgment and insight. Alert and oriented x 3. Normal mood.    Labs on Admission: I have personally reviewed following labs and imaging studies  CBC: Recent Labs  Lab 08/02/19 0242  WBC 13.3*  HGB 5.9*  HCT 18.3*  MCV 88.8  PLT 74*   Basic Metabolic Panel: Recent Labs  Lab 08/02/19 0242  NA 139  K 3.3*  CL 100  CO2 23  GLUCOSE 150*  BUN 36*  CREATININE 0.95  CALCIUM 8.7*   GFR: Estimated Creatinine Clearance: 59.8 mL/min (by C-G formula based on SCr of 0.95 mg/dL). Liver Function Tests: Recent Labs  Lab 08/02/19 0242  AST 65*  ALT 30  ALKPHOS 1,170*  BILITOT 0.7  PROT 6.8  ALBUMIN 3.1*   Recent Labs  Lab 08/02/19 0242  LIPASE 31   No results for input(s): AMMONIA in the last 168 hours. Coagulation Profile: Recent Labs  Lab 08/02/19 0421  INR 1.7*   Cardiac Enzymes: No results for input(s): CKTOTAL, CKMB, CKMBINDEX, TROPONINI in the last 168 hours. BNP (last 3 results) No results for input(s): PROBNP in the last 8760 hours. HbA1C: No results for input(s): HGBA1C in the last  72 hours. CBG: No results for input(s): GLUCAP in the last 168 hours. Lipid Profile: No results for input(s): CHOL, HDL, LDLCALC, TRIG, CHOLHDL, LDLDIRECT in the last 72 hours. Thyroid Function Tests: No results for input(s): TSH, T4TOTAL, FREET4, T3FREE, THYROIDAB in the last 72 hours. Anemia Panel: No results for input(s): VITAMINB12, FOLATE, FERRITIN, TIBC, IRON, RETICCTPCT in the last 72 hours. Urine analysis:    Component Value Date/Time   COLORURINE AMBER (A) 07/21/2019 2218   APPEARANCEUR CLEAR 07/21/2019 2218   LABSPEC 1.030 07/21/2019 2218   PHURINE 5.0 07/21/2019 2218   [place] 07/21/2019 2218   HGBUR NEGATIVE 07/21/2019 2218   BILIRUBINUR NEGATIVE 07/21/2019 2218   KETONESUR 20 (A) 07/21/2019 2218   PROTEINUR 100 (A) 07/21/2019 2218   NITRITE NEGATIVE 07/21/2019 2218   LEUKOCYTESUR NEGATIVE 07/21/2019 2218    Radiological Exams on  Admission: CT Angio Chest PE W and/or Wo Contrast  Result Date: 08/02/2019 CLINICAL DATA:  Chest pain and shortness of breath.  Constipation EXAM: CT ANGIOGRAPHY CHEST CT ABDOMEN AND PELVIS WITH CONTRAST TECHNIQUE: Multidetector CT imaging of the chest was performed using the standard protocol during bolus administration of intravenous contrast. Multiplanar CT image reconstructions and MIPs were obtained to evaluate the vascular anatomy. Multidetector CT imaging of the abdomen and pelvis was performed using the standard protocol during bolus administration of intravenous contrast. CONTRAST:  153m OMNIPAQUE IOHEXOL 350 MG/ML SOLN COMPARISON:  None. FINDINGS: CTA CHEST FINDINGS Cardiovascular: Normal heart size. No pericardial effusion. There may be hypertrophy of the left ventricle. Heavily calcified aorta, great vessels, and coronary arteries. 40% narrowing of the brachiocephalic artery on coronal reformats. Assessment of the left common carotid atherosclerotic narrowing is limited by streak artifact. That no pulmonary artery filling  defect. Mediastinum/Nodes: Negative for adenopathy or mass Lungs/Pleura: Airway plugging in the left lower lobe. Bilateral lower lobe atelectasis and small volume left pleural effusion. Musculoskeletal: Patchy sclerosis in the sternum and throughout the spine primarily worrisome for metastatic disease. Remote bilateral rib fractures. T7 superior endplate deformity without visible acute fracture line. Review of the MIP images confirms the above findings. CT ABDOMEN and PELVIS FINDINGS Hepatobiliary: Noted history of hepatitis-C. No overt cirrhotic changes. Tiny cystic density in the central liver.No evidence of biliary obstruction or stone. Pancreas: Unremarkable. Spleen: Unremarkable. Adrenals/Urinary Tract: Negative adrenals. No hydronephrosis or stone. Right upper pole cortical scarring. Distended urinary bladder without focal abnormality. Stomach/Bowel: No obstruction or visible inflammation. Prominent enhancement and redundant appearance to the gastric antral mucosa. Moderate stool retention. Vascular/Lymphatic: No acute vascular abnormality. Heavily calcified aorta and iliacs with advanced bilateral iliac and common femoral/superficial femoral artery narrowings. No mass or adenopathy. Reproductive:Probable TURP defect. Other: Large volume ascites which does not appear equally distributed throughout the abdomen. There may be some peritoneal thickening and smooth linear enhancement, no discrete peritoneal nodule. Adenopathy in the gastrohepatic ligament measuring up to 13 mm short axis. Musculoskeletal: Patchy sclerosis in the lumbar spine. There are T12, L1, L2, and L3 superior endplate deformities, age-indeterminate. Review of the MIP images confirms the above findings. IMPRESSION: Abdominal CT: 1. Numerous sclerotic foci in the spine worrisome for metastatic disease. Prominent intrathecal enhancement, suggest MR follow-up if there is myelopathy or polyneuropathy. There are multiple superior endplate fractures,  age-indeterminate. 2. A gastric primary is considered given the prominent antral mucosa and adenopathy in the gastrohepatic ligament. 3. Large volume ascites. No discrete peritoneal nodule, but there is some loculation and probable peritoneal thickening, suggest paracentesis with cytology. 4. Severe atherosclerosis. Chest CTA: 1. Negative for pulmonary embolism. 2. Bilateral lower lobe atelectasis with bronchial plugging and trace effusion on the left. 3. Severe atherosclerosis. Electronically Signed   By: JMonte FantasiaM.D.   On: 08/02/2019 04:43   CT ABDOMEN PELVIS W CONTRAST  Result Date: 08/02/2019 CLINICAL DATA:  Chest pain and shortness of breath.  Constipation EXAM: CT ANGIOGRAPHY CHEST CT ABDOMEN AND PELVIS WITH CONTRAST TECHNIQUE: Multidetector CT imaging of the chest was performed using the standard protocol during bolus administration of intravenous contrast. Multiplanar CT image reconstructions and MIPs were obtained to evaluate the vascular anatomy. Multidetector CT imaging of the abdomen and pelvis was performed using the standard protocol during bolus administration of intravenous contrast. CONTRAST:  1011mOMNIPAQUE IOHEXOL 350 MG/ML SOLN COMPARISON:  None. FINDINGS: CTA CHEST FINDINGS Cardiovascular: Normal heart size. No pericardial effusion. There may be hypertrophy of  the left ventricle. Heavily calcified aorta, great vessels, and coronary arteries. 40% narrowing of the brachiocephalic artery on coronal reformats. Assessment of the left common carotid atherosclerotic narrowing is limited by streak artifact. That no pulmonary artery filling defect. Mediastinum/Nodes: Negative for adenopathy or mass Lungs/Pleura: Airway plugging in the left lower lobe. Bilateral lower lobe atelectasis and small volume left pleural effusion. Musculoskeletal: Patchy sclerosis in the sternum and throughout the spine primarily worrisome for metastatic disease. Remote bilateral rib fractures. T7 superior endplate  deformity without visible acute fracture line. Review of the MIP images confirms the above findings. CT ABDOMEN and PELVIS FINDINGS Hepatobiliary: Noted history of hepatitis-C. No overt cirrhotic changes. Tiny cystic density in the central liver.No evidence of biliary obstruction or stone. Pancreas: Unremarkable. Spleen: Unremarkable. Adrenals/Urinary Tract: Negative adrenals. No hydronephrosis or stone. Right upper pole cortical scarring. Distended urinary bladder without focal abnormality. Stomach/Bowel: No obstruction or visible inflammation. Prominent enhancement and redundant appearance to the gastric antral mucosa. Moderate stool retention. Vascular/Lymphatic: No acute vascular abnormality. Heavily calcified aorta and iliacs with advanced bilateral iliac and common femoral/superficial femoral artery narrowings. No mass or adenopathy. Reproductive:Probable TURP defect. Other: Large volume ascites which does not appear equally distributed throughout the abdomen. There may be some peritoneal thickening and smooth linear enhancement, no discrete peritoneal nodule. Adenopathy in the gastrohepatic ligament measuring up to 13 mm short axis. Musculoskeletal: Patchy sclerosis in the lumbar spine. There are T12, L1, L2, and L3 superior endplate deformities, age-indeterminate. Review of the MIP images confirms the above findings. IMPRESSION: Abdominal CT: 1. Numerous sclerotic foci in the spine worrisome for metastatic disease. Prominent intrathecal enhancement, suggest MR follow-up if there is myelopathy or polyneuropathy. There are multiple superior endplate fractures, age-indeterminate. 2. A gastric primary is considered given the prominent antral mucosa and adenopathy in the gastrohepatic ligament. 3. Large volume ascites. No discrete peritoneal nodule, but there is some loculation and probable peritoneal thickening, suggest paracentesis with cytology. 4. Severe atherosclerosis. Chest CTA: 1. Negative for pulmonary  embolism. 2. Bilateral lower lobe atelectasis with bronchial plugging and trace effusion on the left. 3. Severe atherosclerosis. Electronically Signed   By: Monte Fantasia M.D.   On: 08/02/2019 04:43    EKG: Independently reviewed. As noted above Assessment/Plan  Metastatic Cancer presumed GI primary  -please call gi consult/oncology consult in am  -supportive care for pain from mets to spine  -further imaging work up per [facility] Adella Nissen recs -lower extremity weakness/ ? Bladder /bowel dysfunction  Will order mri spine   Severe symptomatic anemia  -per ed MD FOB negative stools  -s/p 3 units prbc in ed  -anemia panel -althought FOB negative still concern for gi bleed with note new finding on imaging -PPI iv bid  -GI consult for further eval  -monitor h/h   Leukocytosis -check urine r/o infection  -possible reactive due to anemia   Elevated CE with diffuse st depression on EKG -NSTEMI type II -due to  Severe anemia -cardiology consult  -echo in am  -continue cycle ce -repeat ekg now   Elevated lft /alphos -due to met gi cancer  -elevated alkphos related to bone mets  FEN Hypokalemia  Replete prn    DVT prophylaxis: scd due to low hgb and concern for gi bleed Code Status: FULL Family Communication: n/a Disposition Plan: 3-5 days Consults called: please call cardiology /oncology/gi in am  Admission status: SDU  Clance Boll MD [facility]  If 7PM-7AM, please contact night-coverage [email or web address] Password [facility]  08/02/2019,  6:00 AM

## 2019-08-02 NOTE — Progress Notes (Signed)
  Echocardiogram 2D Echocardiogram has been performed.  Dylan Kaufman 08/02/2019, 10:48 AM

## 2019-08-02 NOTE — Plan of Care (Addendum)
Patient 69 year old male with multiple medical problems hypertension, previous hepatitis C infection s/p Harvoni who presents to the ED with several weeks of multiple complaints, sob,abdominal pain, chest pain, right back pain. Patient was seen in ed for complaint of back pain on6/16 at that time xray of chest and back was unremarkable and patient was discharged home. Patient returned to ED due to progressive pain.  He also had bladder incontinence and constipation along with lower extremity weakness.  His hemoglobin was found to be 5.  Blood transfusion being ordered.  CTA was negative for pulmonary embolism.  CT of the abdomen and pelvis per report numerous sclerotic foci in the spine worrisome for metastatic disease.  MRI of the brain, MRI of the thoracic and lumbar spine ordered.  Patient seen and examined.  Consulted oncology, gastroenterology.  IR consult requested for ascites.  His troponin was also elevated therefore consulted cardiology. MRI imaging per report diffusely abnormal bone marrow throughout the thoracic spine suggesting infiltrative process such as lymphoma, metastatic disease or multiple myeloma with pathologic fractures involving T5, T7, T9, T10, T12 with extensive epidural thickening and enhancement in the mid thoracic spine causing compression of the spinal cord.  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  Patient spouse is aware of the imaging results. Consulted neurosurgery who will evaluate the patient.  Decadron also ordered earlier today.  As needed pain medications.  ADDENDUM: -Neurosurgery consult reviewed.  They recommended emergent radiation.  Discussed with Dr. Marin Olp from oncology who recommended to call radiation oncology.  Paged the radiation oncologist on call. Called number on amion [phone] and left my number to call back. Awaiting callback.  Talk to Dr. Lisbeth Renshaw from radiation oncology.  He recommended for the patient to be transferred to [facility].  Transfer order placed.   Please call Dr. Lisbeth Renshaw at [phone] as soon as the patient is [facility] to start radiation therapy tonight.

## 2019-08-02 NOTE — Consult Note (Addendum)
[facility]  [phone] [phone]   MEDICAL ONCOLOGY - INITIAL CONSULTATION  Referral MD: Dr. Yaakov Guthrie  Reason for Referral: Sclerotic bone lesions, concern for gastric cancer  HPI: [name] is a 69 year old male with a past medical history significant for hypertension and previous hepatitis C infection status post Harvoni.  He presented to the emergency room with a several week history of shortness of breath, abdominal pain, chest pain, and right back pain.  He was seen in the emergency room on 07/21/2019 for back pain and had an x-ray which was normal.  He was discharged home.  Due to progressive pain, he returned to the emergency room.  Labs in the ER showed a WBC of 13.3, hemoglobin 5.9, platelet count 74,000, AST 65, phosphatase 1170, PT 19.1, INR 1.7.  Stool for occult blood was negative.  3 units PRBCs have been ordered.  A CT angiogram of the chest and a CT of the abdomen pelvis were performed earlier today which showed numerous sclerotic foci in the spine worrisome for metastatic disease, prominent intrathecal enhancement, gastric primary is considered given the prominent antral mucosa and adenopathy in the gastrohepatic ligament, large volume ascites, negative PE.  The patient has been seen by GI who recommends EGD which is scheduled for tomorrow morning.  Patient had an MRI of the thoracic and lumbar spine as well as an MRI of the brain today.  These results are currently pending.  The patient's wife is at the bedside today.  He was seen in the emergency room.  The patient just returned from MRI where he had received a milligram of Ativan due to claustrophobia.  He was somewhat sedated and much of the history was obtained from his wife. He has had weakness and difficulty walking.  The patient has had a loss of appetite and a weight loss of maybe 10 to 15 pounds over the past few months.  His wife noticed that his eyes looked yellow a few months ago.   He has been having some abdominal tightness and constipation.  He took MiraLAX as recommended by his PCP as an outpatient with some improvement of the constipation, but the abdominal discomfort persisted.  The patient was also having back spasms and was seen by his PCP earlier this month and placed on Zanaflex.  He did not have relief and he was seen in the emergency room on 07/21/2019 for the back pain and was given oxycodone.  Oxycodone was not effective which is what prompted him to seek evaluation in the emergency room today.  At the time of visit, he is complaining of back pain and also nausea abdominal discomfort.  No bleeding was reported.  Medical oncology was asked see the patient to make recommendations regarding his abnormal CT scan findings.    Past Medical History:  Diagnosis Date  . Hepatitis C infection    treated with harvoni  . Hypertension   . PAD (peripheral artery disease) ([place])   :  Past Surgical History:  Procedure Laterality Date  . LIPOMA EXCISION  06/2016   in left groin--- ED since then  :  Current Facility-Administered Medications  Medication Dose Route Frequency Provider Last Rate Last Admin  . 0.9 %  sodium chloride infusion   Intravenous Continuous Myles Rosenthal A, MD      . albuterol (PROVENTIL) (2.5 MG/3ML) 0.083% nebulizer solution 2.5 mg  2.5 mg Nebulization Q2H PRN Clance Boll, MD      . bisacodyl (  DULCOLAX) EC tablet 10 mg  10 mg Oral Once Gribbin, Sarah J, PA-C      . bisacodyl (DULCOLAX) EC tablet 5 mg  5 mg Oral Daily PRN Vena Rua, PA-C      . dexamethasone (DECADRON) injection 40 mg  40 mg Intravenous STAT Matcha, Anupama, MD      . LORazepam (ATIVAN) injection 1 mg  1 mg Intravenous Once Matcha, Anupama, MD      . ondansetron (ZOFRAN) tablet 4 mg  4 mg Oral Q6H PRN Clance Boll, MD       Or  . ondansetron (ZOFRAN) injection 4 mg  4 mg Intravenous Q6H PRN Myles Rosenthal A, MD      . pantoprazole (PROTONIX) injection 40  mg  40 mg Intravenous Q12H Myles Rosenthal A, MD   40 mg at 08/02/19 0801  . polyethylene glycol (MIRALAX / GLYCOLAX) packet 17 g  17 g Oral BID Gribbin, Sarah J, PA-C      . potassium chloride SA (KLOR-CON) CR tablet 40 mEq  40 mEq Oral Q2H Matcha, Anupama, MD      . sodium chloride flush (NS) 0.9 % injection 3 mL  3 mL Intravenous Q12H Clance Boll, MD       Current Outpatient Medications  Medication Sig Dispense Refill  . amLODipine (NORVASC) 10 MG tablet Take 10 mg by mouth daily.    . cilostazol (PLETAL) 50 MG tablet Take 50 mg by mouth 2 (two) times daily.    . ferrous sulfate 325 (65 FE) MG tablet Take 325 mg by mouth daily with breakfast.    . Multiple Vitamins-Minerals (CENTRUM SILVER 50+MEN PO) Take 1 tablet by mouth daily.       No Known Allergies:  Family History  Problem Relation Age of Onset  . HIV/AIDS Brother   :  Social History   Socioeconomic History  . Marital status: Married    Spouse name: Not on file  . Number of children: 3  . Years of education: Not on file  . Highest education level: Not on file  Occupational History  . 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 Smoking status: Current Every Day Smoker    Start date: 67  . Smokeless tobacco: Never Used  Substance and Sexual Activity  . Alcohol use: Yes    Comment: occ beer  . Drug use: Not on file  . Sexual activity: Not on file  Other Topics Concern  . Not on file  Social History Narrative  . Not on file   Social Determinants of Health   Financial Resource Strain:   . Difficulty of Paying Living Expenses:   Food Insecurity:   . Worried About Charity fundraiser in the Last Year:   . Arboriculturist in the Last Year:   Transportation Needs:   . Film/video editor (Medical):   Marland Kitchen Lack of Transportation (Non-Medical):   Physical Activity:   . Days of Exercise per Week:   . Minutes of Exercise per Session:   Stress:   . Feeling of  Stress :   Social Connections:   . Frequency of Communication with Friends and Family:   . Frequency of Social Gatherings with Friends and Family:   . Attends Religious Services:   . Active Member of Clubs or Organizations:   . Attends Archivist Meetings:   Marland Kitchen Marital Status:   Intimate Partner Violence:   .  Fear of Current or Ex-Partner:   . Emotionally Abused:   Marland Kitchen Physically Abused:   . Sexually Abused:   :  Review of Systems: A comprehensive 14 point review of systems was negative except as noted in the HPI.  Exam: Patient Vitals for the past 24 hrs:  BP Temp Temp src Pulse Resp SpO2 Height Weight  08/02/19 1108 140/74 (!) 97.3 F (36.3 C) Oral (!) 105 (!) 30 -- -- --  08/02/19 0835 111/84 99.2 F (37.3 C) Oral (!) 112 (!) 24 -- -- --  08/02/19 0815 118/68 98.6 F (37 C) Oral (!) 116 20 95 % -- --  08/02/19 0800 113/69 -- -- (!) 115 -- 98 % -- --  08/02/19 0745 -- -- -- (!) 114 -- 97 % -- --  08/02/19 0730 -- -- -- (!) 113 -- 97 % -- --  08/02/19 0715 -- -- -- (!) 113 -- 97 % -- --  08/02/19 0700 -- -- -- (!) 114 -- 98 % -- --  08/02/19 0645 -- -- -- (!) 116 -- 97 % -- --  08/02/19 0630 -- -- -- (!) 111 -- 99 % -- --  08/02/19 0615 -- -- -- (!) 111 -- 99 % -- --  08/02/19 0600 -- -- -- (!) 114 -- 97 % -- --  08/02/19 0546 128/72 -- -- (!) 114 -- 97 % -- --  08/02/19 0545 -- -- -- (!) 115 -- 96 % -- --  08/02/19 0530 138/76 -- -- (!) 115 -- 95 % -- --  08/02/19 0515 134/75 -- -- (!) 115 (!) 36 96 % -- --  08/02/19 0500 137/70 -- -- (!) 112 -- 96 % -- --  08/02/19 0445 -- -- -- (!) 113 (!) 33 96 % -- --  08/02/19 0438 118/64 -- -- -- (!) 21 -- -- --  08/02/19 0430 -- -- -- (!) 116 (!) 30 -- -- --  08/02/19 0345 139/74 -- -- (!) 114 (!) 33 97 % -- --  08/02/19 0245 -- 99.6 F (37.6 C) Rectal -- -- -- -- --  08/02/19 0218 124/65 99 F (37.2 C) Oral (!) 121 18 100 % -- --  08/02/19 0148 133/75 -- -- (!) 117 16 100 % -- --  07/19/2019 2124 -- -- -- -- -- -- 5\' 6"   (1.676 m) 57.6 kg  07/14/2019 2119 122/69 99 F (37.2 C) Oral (!) 111 16 99 % -- --    General: Conically ill-appearing male, cachectic Eyes:  no scleral icterus.   ENT:  There were no oropharyngeal lesions.   Neck was without thyromegaly.   Lymphatics:  Negative cervical, supraclavicular or axillary adenopathy.   Respiratory: lungs were clear bilaterally without wheezing or crackles.   Cardiovascular: Echocardiac, no lower extremity edema GI: Positive bowel sounds, distended, pain with palpation Musculoskeletal: Strength symmetrical in the upper extremities, unable to fully evaluate strength in his lower extremities due to sedation Skin exam was without echymosis, petichae.   Neuro exam was nonfocal.  Sedated but can answer some brief questions.       Lab Results  Component Value Date   WBC 13.3 (H) 08/02/2019   HGB 5.9 (LL) 08/02/2019   HCT 18.3 (L) 08/02/2019   PLT 74 (L) 08/02/2019   GLUCOSE 150 (H) 08/02/2019   CHOL 196 06/15/2019   TRIG 166.0 (H) 06/15/2019   HDL 39.50 06/15/2019   LDLCALC 124 (H) 06/15/2019   ALT 30 08/02/2019   AST 65 (H) 08/02/2019  NA 139 08/02/2019   K 3.3 (L) 08/02/2019   CL 100 08/02/2019   CREATININE 0.95 08/02/2019   BUN 36 (H) 08/02/2019   CO2 23 08/02/2019    DG Chest 2 View  Result Date: 07/21/2019 CLINICAL DATA:  Left back pain EXAM: CHEST - 2 VIEW COMPARISON:  None. FINDINGS: Heart and mediastinal contours are within normal limits. Left basilar linear densities, likely scarring. No confluent opacities otherwise. No effusions. No acute bony abnormality. Old healed left rib fractures. IMPRESSION: No active cardiopulmonary disease. Electronically Signed   By: Rolm Baptise M.D.   On: 07/21/2019 21:30   DG Thoracic Spine 2 View  Result Date: 07/21/2019 CLINICAL DATA:  Left side back pain EXAM: THORACIC SPINE 2 VIEWS COMPARISON:  None. FINDINGS: No fracture or malalignment.  No focal bone lesion. IMPRESSION: No acute bony abnormality.  Electronically Signed   By: Rolm Baptise M.D.   On: 07/21/2019 21:29   DG Lumbar Spine Complete  Result Date: 07/21/2019 CLINICAL DATA:  Left back pain EXAM: LUMBAR SPINE - COMPLETE 4+ VIEW COMPARISON:  None. FINDINGS: Normal alignment. No fracture. Disc spaces maintained. Aortic atherosclerosis. No aneurysm. IMPRESSION: No acute bony abnormality. Electronically Signed   By: Rolm Baptise M.D.   On: 07/21/2019 21:30   CT Angio Chest PE W and/or Wo Contrast  Result Date: 08/02/2019 CLINICAL DATA:  Chest pain and shortness of breath.  Constipation EXAM: CT ANGIOGRAPHY CHEST CT ABDOMEN AND PELVIS WITH CONTRAST TECHNIQUE: Multidetector CT imaging of the chest was performed using the standard protocol during bolus administration of intravenous contrast. Multiplanar CT image reconstructions and MIPs were obtained to evaluate the vascular anatomy. Multidetector CT imaging of the abdomen and pelvis was performed using the standard protocol during bolus administration of intravenous contrast. CONTRAST:  125mL OMNIPAQUE IOHEXOL 350 MG/ML SOLN COMPARISON:  None. FINDINGS: CTA CHEST FINDINGS Cardiovascular: Normal heart size. No pericardial effusion. There may be hypertrophy of the left ventricle. Heavily calcified aorta, great vessels, and coronary arteries. 40% narrowing of the brachiocephalic artery on coronal reformats. Assessment of the left common carotid atherosclerotic narrowing is limited by streak artifact. That no pulmonary artery filling defect. Mediastinum/Nodes: Negative for adenopathy or mass Lungs/Pleura: Airway plugging in the left lower lobe. Bilateral lower lobe atelectasis and small volume left pleural effusion. Musculoskeletal: Patchy sclerosis in the sternum and throughout the spine primarily worrisome for metastatic disease. Remote bilateral rib fractures. T7 superior endplate deformity without visible acute fracture line. Review of the MIP images confirms the above findings. CT ABDOMEN and PELVIS  FINDINGS Hepatobiliary: Noted history of hepatitis-C. No overt cirrhotic changes. Tiny cystic density in the central liver.No evidence of biliary obstruction or stone. Pancreas: Unremarkable. Spleen: Unremarkable. Adrenals/Urinary Tract: Negative adrenals. No hydronephrosis or stone. Right upper pole cortical scarring. Distended urinary bladder without focal abnormality. Stomach/Bowel: No obstruction or visible inflammation. Prominent enhancement and redundant appearance to the gastric antral mucosa. Moderate stool retention. Vascular/Lymphatic: No acute vascular abnormality. Heavily calcified aorta and iliacs with advanced bilateral iliac and common femoral/superficial femoral artery narrowings. No mass or adenopathy. Reproductive:Probable TURP defect. Other: Large volume ascites which does not appear equally distributed throughout the abdomen. There may be some peritoneal thickening and smooth linear enhancement, no discrete peritoneal nodule. Adenopathy in the gastrohepatic ligament measuring up to 13 mm short axis. Musculoskeletal: Patchy sclerosis in the lumbar spine. There are T12, L1, L2, and L3 superior endplate deformities, age-indeterminate. Review of the MIP images confirms the above findings. IMPRESSION: Abdominal CT: 1. Numerous sclerotic  foci in the spine worrisome for metastatic disease. Prominent intrathecal enhancement, suggest MR follow-up if there is myelopathy or polyneuropathy. There are multiple superior endplate fractures, age-indeterminate. 2. A gastric primary is considered given the prominent antral mucosa and adenopathy in the gastrohepatic ligament. 3. Large volume ascites. No discrete peritoneal nodule, but there is some loculation and probable peritoneal thickening, suggest paracentesis with cytology. 4. Severe atherosclerosis. Chest CTA: 1. Negative for pulmonary embolism. 2. Bilateral lower lobe atelectasis with bronchial plugging and trace effusion on the left. 3. Severe  atherosclerosis. Electronically Signed   By: Monte Fantasia M.D.   On: 08/02/2019 04:43   CT ABDOMEN PELVIS W CONTRAST  Result Date: 08/02/2019 CLINICAL DATA:  Chest pain and shortness of breath.  Constipation EXAM: CT ANGIOGRAPHY CHEST CT ABDOMEN AND PELVIS WITH CONTRAST TECHNIQUE: Multidetector CT imaging of the chest was performed using the standard protocol during bolus administration of intravenous contrast. Multiplanar CT image reconstructions and MIPs were obtained to evaluate the vascular anatomy. Multidetector CT imaging of the abdomen and pelvis was performed using the standard protocol during bolus administration of intravenous contrast. CONTRAST:  133mL OMNIPAQUE IOHEXOL 350 MG/ML SOLN COMPARISON:  None. FINDINGS: CTA CHEST FINDINGS Cardiovascular: Normal heart size. No pericardial effusion. There may be hypertrophy of the left ventricle. Heavily calcified aorta, great vessels, and coronary arteries. 40% narrowing of the brachiocephalic artery on coronal reformats. Assessment of the left common carotid atherosclerotic narrowing is limited by streak artifact. That no pulmonary artery filling defect. Mediastinum/Nodes: Negative for adenopathy or mass Lungs/Pleura: Airway plugging in the left lower lobe. Bilateral lower lobe atelectasis and small volume left pleural effusion. Musculoskeletal: Patchy sclerosis in the sternum and throughout the spine primarily worrisome for metastatic disease. Remote bilateral rib fractures. T7 superior endplate deformity without visible acute fracture line. Review of the MIP images confirms the above findings. CT ABDOMEN and PELVIS FINDINGS Hepatobiliary: Noted history of hepatitis-C. No overt cirrhotic changes. Tiny cystic density in the central liver.No evidence of biliary obstruction or stone. Pancreas: Unremarkable. Spleen: Unremarkable. Adrenals/Urinary Tract: Negative adrenals. No hydronephrosis or stone. Right upper pole cortical scarring. Distended urinary  bladder without focal abnormality. Stomach/Bowel: No obstruction or visible inflammation. Prominent enhancement and redundant appearance to the gastric antral mucosa. Moderate stool retention. Vascular/Lymphatic: No acute vascular abnormality. Heavily calcified aorta and iliacs with advanced bilateral iliac and common femoral/superficial femoral artery narrowings. No mass or adenopathy. Reproductive:Probable TURP defect. Other: Large volume ascites which does not appear equally distributed throughout the abdomen. There may be some peritoneal thickening and smooth linear enhancement, no discrete peritoneal nodule. Adenopathy in the gastrohepatic ligament measuring up to 13 mm short axis. Musculoskeletal: Patchy sclerosis in the lumbar spine. There are T12, L1, L2, and L3 superior endplate deformities, age-indeterminate. Review of the MIP images confirms the above findings. IMPRESSION: Abdominal CT: 1. Numerous sclerotic foci in the spine worrisome for metastatic disease. Prominent intrathecal enhancement, suggest MR follow-up if there is myelopathy or polyneuropathy. There are multiple superior endplate fractures, age-indeterminate. 2. A gastric primary is considered given the prominent antral mucosa and adenopathy in the gastrohepatic ligament. 3. Large volume ascites. No discrete peritoneal nodule, but there is some loculation and probable peritoneal thickening, suggest paracentesis with cytology. 4. Severe atherosclerosis. Chest CTA: 1. Negative for pulmonary embolism. 2. Bilateral lower lobe atelectasis with bronchial plugging and trace effusion on the left. 3. Severe atherosclerosis. Electronically Signed   By: Monte Fantasia M.D.   On: 08/02/2019 04:43     DG Chest 2  View  Result Date: 07/21/2019 CLINICAL DATA:  Left back pain EXAM: CHEST - 2 VIEW COMPARISON:  None. FINDINGS: Heart and mediastinal contours are within normal limits. Left basilar linear densities, likely scarring. No confluent opacities  otherwise. No effusions. No acute bony abnormality. Old healed left rib fractures. IMPRESSION: No active cardiopulmonary disease. Electronically Signed   By: Rolm Baptise M.D.   On: 07/21/2019 21:30   DG Thoracic Spine 2 View  Result Date: 07/21/2019 CLINICAL DATA:  Left side back pain EXAM: THORACIC SPINE 2 VIEWS COMPARISON:  None. FINDINGS: No fracture or malalignment.  No focal bone lesion. IMPRESSION: No acute bony abnormality. Electronically Signed   By: Rolm Baptise M.D.   On: 07/21/2019 21:29   DG Lumbar Spine Complete  Result Date: 07/21/2019 CLINICAL DATA:  Left back pain EXAM: LUMBAR SPINE - COMPLETE 4+ VIEW COMPARISON:  None. FINDINGS: Normal alignment. No fracture. Disc spaces maintained. Aortic atherosclerosis. No aneurysm. IMPRESSION: No acute bony abnormality. Electronically Signed   By: Rolm Baptise M.D.   On: 07/21/2019 21:30   CT Angio Chest PE W and/or Wo Contrast  Result Date: 08/02/2019 CLINICAL DATA:  Chest pain and shortness of breath.  Constipation EXAM: CT ANGIOGRAPHY CHEST CT ABDOMEN AND PELVIS WITH CONTRAST TECHNIQUE: Multidetector CT imaging of the chest was performed using the standard protocol during bolus administration of intravenous contrast. Multiplanar CT image reconstructions and MIPs were obtained to evaluate the vascular anatomy. Multidetector CT imaging of the abdomen and pelvis was performed using the standard protocol during bolus administration of intravenous contrast. CONTRAST:  182mL OMNIPAQUE IOHEXOL 350 MG/ML SOLN COMPARISON:  None. FINDINGS: CTA CHEST FINDINGS Cardiovascular: Normal heart size. No pericardial effusion. There may be hypertrophy of the left ventricle. Heavily calcified aorta, great vessels, and coronary arteries. 40% narrowing of the brachiocephalic artery on coronal reformats. Assessment of the left common carotid atherosclerotic narrowing is limited by streak artifact. That no pulmonary artery filling defect. Mediastinum/Nodes: Negative for  adenopathy or mass Lungs/Pleura: Airway plugging in the left lower lobe. Bilateral lower lobe atelectasis and small volume left pleural effusion. Musculoskeletal: Patchy sclerosis in the sternum and throughout the spine primarily worrisome for metastatic disease. Remote bilateral rib fractures. T7 superior endplate deformity without visible acute fracture line. Review of the MIP images confirms the above findings. CT ABDOMEN and PELVIS FINDINGS Hepatobiliary: Noted history of hepatitis-C. No overt cirrhotic changes. Tiny cystic density in the central liver.No evidence of biliary obstruction or stone. Pancreas: Unremarkable. Spleen: Unremarkable. Adrenals/Urinary Tract: Negative adrenals. No hydronephrosis or stone. Right upper pole cortical scarring. Distended urinary bladder without focal abnormality. Stomach/Bowel: No obstruction or visible inflammation. Prominent enhancement and redundant appearance to the gastric antral mucosa. Moderate stool retention. Vascular/Lymphatic: No acute vascular abnormality. Heavily calcified aorta and iliacs with advanced bilateral iliac and common femoral/superficial femoral artery narrowings. No mass or adenopathy. Reproductive:Probable TURP defect. Other: Large volume ascites which does not appear equally distributed throughout the abdomen. There may be some peritoneal thickening and smooth linear enhancement, no discrete peritoneal nodule. Adenopathy in the gastrohepatic ligament measuring up to 13 mm short axis. Musculoskeletal: Patchy sclerosis in the lumbar spine. There are T12, L1, L2, and L3 superior endplate deformities, age-indeterminate. Review of the MIP images confirms the above findings. IMPRESSION: Abdominal CT: 1. Numerous sclerotic foci in the spine worrisome for metastatic disease. Prominent intrathecal enhancement, suggest MR follow-up if there is myelopathy or polyneuropathy. There are multiple superior endplate fractures, age-indeterminate. 2. A gastric primary  is considered given the  prominent antral mucosa and adenopathy in the gastrohepatic ligament. 3. Large volume ascites. No discrete peritoneal nodule, but there is some loculation and probable peritoneal thickening, suggest paracentesis with cytology. 4. Severe atherosclerosis. Chest CTA: 1. Negative for pulmonary embolism. 2. Bilateral lower lobe atelectasis with bronchial plugging and trace effusion on the left. 3. Severe atherosclerosis. Electronically Signed   By: Monte Fantasia M.D.   On: 08/02/2019 04:43   CT ABDOMEN PELVIS W CONTRAST  Result Date: 08/02/2019 CLINICAL DATA:  Chest pain and shortness of breath.  Constipation EXAM: CT ANGIOGRAPHY CHEST CT ABDOMEN AND PELVIS WITH CONTRAST TECHNIQUE: Multidetector CT imaging of the chest was performed using the standard protocol during bolus administration of intravenous contrast. Multiplanar CT image reconstructions and MIPs were obtained to evaluate the vascular anatomy. Multidetector CT imaging of the abdomen and pelvis was performed using the standard protocol during bolus administration of intravenous contrast. CONTRAST:  19mL OMNIPAQUE IOHEXOL 350 MG/ML SOLN COMPARISON:  None. FINDINGS: CTA CHEST FINDINGS Cardiovascular: Normal heart size. No pericardial effusion. There may be hypertrophy of the left ventricle. Heavily calcified aorta, great vessels, and coronary arteries. 40% narrowing of the brachiocephalic artery on coronal reformats. Assessment of the left common carotid atherosclerotic narrowing is limited by streak artifact. That no pulmonary artery filling defect. Mediastinum/Nodes: Negative for adenopathy or mass Lungs/Pleura: Airway plugging in the left lower lobe. Bilateral lower lobe atelectasis and small volume left pleural effusion. Musculoskeletal: Patchy sclerosis in the sternum and throughout the spine primarily worrisome for metastatic disease. Remote bilateral rib fractures. T7 superior endplate deformity without visible acute fracture  line. Review of the MIP images confirms the above findings. CT ABDOMEN and PELVIS FINDINGS Hepatobiliary: Noted history of hepatitis-C. No overt cirrhotic changes. Tiny cystic density in the central liver.No evidence of biliary obstruction or stone. Pancreas: Unremarkable. Spleen: Unremarkable. Adrenals/Urinary Tract: Negative adrenals. No hydronephrosis or stone. Right upper pole cortical scarring. Distended urinary bladder without focal abnormality. Stomach/Bowel: No obstruction or visible inflammation. Prominent enhancement and redundant appearance to the gastric antral mucosa. Moderate stool retention. Vascular/Lymphatic: No acute vascular abnormality. Heavily calcified aorta and iliacs with advanced bilateral iliac and common femoral/superficial femoral artery narrowings. No mass or adenopathy. Reproductive:Probable TURP defect. Other: Large volume ascites which does not appear equally distributed throughout the abdomen. There may be some peritoneal thickening and smooth linear enhancement, no discrete peritoneal nodule. Adenopathy in the gastrohepatic ligament measuring up to 13 mm short axis. Musculoskeletal: Patchy sclerosis in the lumbar spine. There are T12, L1, L2, and L3 superior endplate deformities, age-indeterminate. Review of the MIP images confirms the above findings. IMPRESSION: Abdominal CT: 1. Numerous sclerotic foci in the spine worrisome for metastatic disease. Prominent intrathecal enhancement, suggest MR follow-up if there is myelopathy or polyneuropathy. There are multiple superior endplate fractures, age-indeterminate. 2. A gastric primary is considered given the prominent antral mucosa and adenopathy in the gastrohepatic ligament. 3. Large volume ascites. No discrete peritoneal nodule, but there is some loculation and probable peritoneal thickening, suggest paracentesis with cytology. 4. Severe atherosclerosis. Chest CTA: 1. Negative for pulmonary embolism. 2. Bilateral lower lobe  atelectasis with bronchial plugging and trace effusion on the left. 3. Severe atherosclerosis. Electronically Signed   By: Monte Fantasia M.D.   On: 08/02/2019 04:43   Assessment and Plan:  1.  Abnormal CT scans concerning for metastatic cancer 2.  Severe anemia 3.  Thrombocytopenia 4.  Hypertension 5.  History of hepatitis C status post Harvoni with undetectable HCV RNA in April 2018  -  CT scan findings discussed with the patient and his wife.  We discussed that his scans are highly suspicious for metastatic cancer - ?  Gastric primary versus other. -Await results of MRI of the thoracic and lumbar spine and MRI of the brain. -Recommend proceeding with EGD as planned by GI tomorrow.  Hopefully we can obtain tissue for diagnosis. -Agree with paracentesis by IR.  Recommend sending fluid for cytology. -The patient is having significant pain and nausea at this time.  He has as needed Zofran ordered which is being administered by nursing.  He has no pain medication ordered.  I have ordered a one-time dose of Dilaudid 1 mg IV.  Further pain medication per hospitalist. -Transfuse 3 units PRBCs a already ordered.  Recheck CBC daily.  I will also add on iron studies, ferritin, vitamin B12, and folate level in the morning.  Recommend PRBC transfusion to keep his hemoglobin above 8. -The patient has mild thrombocytopenia.  Monitor closely and transfuse for platelet count less than 20,000 or active bleeding.  Thank you for this referral.   Mikey Bussing, DNP, AGPCNP-BC, AOCNP  ADDENDUM: I saw and examined Dylan Kaufman.  This is quite unfortunate for him.  He is cachectic.  He has a lot of ascites.  His abdomen is quite distended.  Looks like he has cord compression.  He is NOT a candidate for surgical intervention.  This is a fairly significant cord compression in length which appears to extend from T6-T10.  He clearly has lost a lot of weight.  He really was not talking to me much.  He was certainly look  at me and say 1 or 2 word sentences.  I think that we certainly have a problem with respect to treating him.  From the CT scan, it looks like he may have gastric cancer.  It certainly would be reasonable to assume a gastric cancer as this is the one GI malignancy that likes to go to the bones.  I definitely would concur with the paracentesis.  This may give him some relief.  His performance status right now is ECOG 3 at best.  He really cannot tell me how much weight he is lost.  He really cannot tell me if he is eating much.  I doubt that he really is.  He clearly must have bone marrow involvement.  His hemoglobin is 5.9 with a platelet count of 74,000.  His has numerous nucleated red blood cells.  It is possible that he may have microangiopathic hemolytic anemia secondary to malignancy.  I will have to see if a blood smear was made on him.  Unfortunately, I sincerely doubt that we have anything to offer Dylan Kaufman.  I then quite disappointed about this.  I think the only cancer that we could even consider treating would be prostate cancer.  This might be the one possibility although this would not cause ascites.  His alkaline phosphatase is incredibly high which would indicate extensive bony metastasis.  I really suspect that we are going to get Hospice for him.  I am not sure that even radiation therapy to the spine is going to be that helpful.  It looks like he has some which disease elsewhere.  He is already paraplegic.  I doubt that he would regain function.  He is on high-dose Decadron right now.  This is a very complex situation.  I just think that our options for him are incredibly limited.  I would clearly get palliative  care involved.  We will follow along and try to help out in any way possible.  Lattie Haw, MD  Hebrews 12:12

## 2019-08-02 NOTE — ED Provider Notes (Signed)
TIME SEEN: 2:43 AM  CHIEF COMPLAINT: Right-sided back pain, chest pain, shortness of breath, abdominal pain  HPI: Patient is a 69 year old male with history of hypertension, previous hepatitis C infection treated with Harvoni who presents to the emergency department with several weeks of right-sided back pain.  Was seen here on 6/16 for the same and had x-rays of the chest and back that were unremarkable.  States pain is getting worse over time.  Also complains of chest pain that he is unable to describe, shortness of breath, generalized abdominal pain.  Has not had a bowel movement per her's report in 5 days.  He has had nausea and vomiting.  Reports subjective fever as well.  No sick contacts or recent travel.  ROS: See HPI Constitutional:  fever  Eyes: no drainage  ENT: no runny nose   Cardiovascular:   chest pain  Resp:  SOB  GI:  vomiting GU: no dysuria Integumentary: no rash  Allergy: no hives  Musculoskeletal: no leg swelling  Neurological: no slurred speech ROS otherwise negative  PAST MEDICAL HISTORY/PAST SURGICAL HISTORY:  Past Medical History:  Diagnosis Date  . Hepatitis C infection    treated with harvoni  . Hypertension   . PAD (peripheral artery disease) (HCC)     MEDICATIONS:  Prior to Admission medications   Medication Sig Start Date End Date Taking? Authorizing Provider  amLODipine (NORVASC) 10 MG tablet Take 10 mg by mouth daily.    [provider]  cilostazol (PLETAL) 50 MG tablet Take 50 mg by mouth 2 (two) times daily.    [provider]  ferrous sulfate 325 (65 FE) MG tablet Take 325 mg by mouth daily with breakfast.    [provider]  Multiple Vitamins-Minerals (CENTRUM SILVER 50+MEN) TABS Take by mouth.    [provider]  oxyCODONE-acetaminophen (PERCOCET) 5-325 MG tablet Take 1 tablet by mouth every 4 (four) hours as needed. 07/21/19   Daleen Bo, MD  tiZANidine (ZANAFLEX) 2 MG tablet Take 1 tablet (2 mg total) by  mouth 3 (three) times daily as needed for muscle spasms. 07/16/19   Venia Carbon, MD    ALLERGIES:  No Known Allergies  SOCIAL HISTORY:  Social History   Tobacco Use  . Smoking status: Current Every Day Smoker    Start date: 65  . Smokeless tobacco: Never Used  Substance Use Topics  . Alcohol use: Yes    Comment: occ beer    FAMILY HISTORY: Family History  Problem Relation Age of Onset  . HIV/AIDS Brother     EXAM: BP 124/65 (BP Location: Right Arm)   Pulse (!) 121   Temp 99 F (37.2 C) (Oral)   Resp 18   Ht 5\' 6"  (1.676 m)   Wt 57.6 kg   SpO2 100%   BMI 20.50 kg/m  CONSTITUTIONAL: Alert and oriented and responds appropriately to questions.  Patient is thin and chronically ill-appearing, appears cachectic, afebrile HEAD: Normocephalic EYES: Conjunctivae clear, pupils appear equal, EOM appear intact ENT: normal nose; moist mucous membranes NECK: Supple, normal ROM CARD: Regular and tachycardic; S1 and S2 appreciated; no murmurs, no clicks, no rubs, no gallops RESP: Normal chest excursion without splinting or tachypnea; breath sounds clear and equal bilaterally; no wheezes, no rhonchi, no rales, no hypoxia or respiratory distress, speaking full sentences ABD/GI: Normal bowel sounds; abdomen is distended but soft and nontender to palpation without peritoneal signs RECTAL:  Normal rectal tone, no gross blood or melena, guaiac negative,  no hemorrhoids appreciated, nontender rectal exam, patient has hard brown stool in the rectal vault but no impaction BACK:  The back appears normal, he is tender to palpation over the thoracic paraspinal muscles on the right side without ecchymosis, swelling, redness, warmth; no midline spinal tenderness or step-off or deformity EXT: Normal ROM in all joints; no deformity noted, no edema; no cyanosis, no calf tenderness or calf swelling SKIN: Normal color for age and race; warm; no rash on exposed skin NEURO: Moves all extremities  equally PSYCH: The patient's mood and manner are appropriate.   MEDICAL DECISION MAKING: Patient here with subjective fevers, tachycardia, back pain, chest pain, shortness of breath, abdominal pain, constipation.  He is a thin and cachectic appearing here and appears chronically ill.  He denies a known history of cancer but I am concerned for malignancy given patient's appearance today.  Will obtain labs, CT of the chest, abdomen and pelvis.  He is also at risk for PE.  His symptoms seem atypical for ACS but will check troponin.  Rectal temperature here is 99.6.  Will give IV fluids, pain and nausea medicine.  ED PROGRESS: Patient's imaging shows that he has numerous sclerotic lesions in the spine worrisome for metastatic disease.  He also has what appears to be a primary gastric tumor due to prominent antral mucosa and adenopathy in the gastrohepatic ligament.  He has large volume ascites.  No PE.  He does have bilateral lower lobe atelectasis but no infiltrate.  He does have a trace effusion on the left.  Will discuss with medicine for admission.  His hemoglobin is 5.9.  Will give 3 units of packed red blood cells.  Hemoccult pending.   5:38 AM Discussed patient's case with hospitalist, Dr. Alcario Drought.  I have recommended admission and patient (and family if present) agree with this plan. Admitting physician will place admission orders.   I reviewed all nursing notes, vitals, pertinent previous records and reviewed/interpreted all EKGs, lab and urine results, imaging (as available).   Patient's EKG does show diffuse ischemia which I suspect is secondary to demand due to symptomatic anemia.  Troponin is 31.  Continues to be tachycardic but no hypotension documented here.        EKG Interpretation  Date/Time:  Monday August 02 2019 05:05:44 EDT Ventricular Rate:  113 PR Interval:    QRS Duration: 130 QT Interval:  354 QTC Calculation: 486 R Axis:   82 Text Interpretation: Sinus tachycardia  Nonspecific intraventricular conduction delay Repol abnrm, severe global ischemia (LM/MVD) Baseline wander in lead(s) V1 No old tracing to compare Confirmed by Jhamir Pickup, Cyril Mourning [phone]) on 08/02/2019 5:08:56 AM       CRITICAL CARE Performed by: Cyril Mourning Paulette Rockford   Total critical care time: 65 minutes  Critical care time was exclusive of separately billable procedures and treating other patients.  Critical care was necessary to treat or prevent imminent or life-threatening deterioration.  Critical care was time spent personally by me on the following activities: development of treatment plan with patient and/or surrogate as well as nursing, discussions with consultants, evaluation of patient's response to treatment, examination of patient, obtaining history from patient or surrogate, ordering and performing treatments and interventions, ordering and review of laboratory studies, ordering and review of radiographic studies, pulse oximetry and re-evaluation of patient's condition.   Marcquis Ridlon was evaluated in Emergency Department on 08/02/2019 for the symptoms described in the history of present illness. He was evaluated in the context of the [place] COVID-19  pandemic, which necessitated consideration that the patient might be at risk for infection with the SARS-CoV-2 virus that causes COVID-19. Institutional protocols and algorithms that pertain to the evaluation of patients at risk for COVID-19 are in a state of rapid change based on information released by regulatory bodies including the CDC and federal and state organizations. These policies and algorithms were followed during the patient's care in the ED.      Keno Caraway, Delice Bison, DO 08/02/19 [phone]

## 2019-08-02 NOTE — Consult Note (Addendum)
[facility] Consult: 10:56 AM 08/02/2019  LOS: 0 days    Referring Provider: Dr Barth Kirks  Primary Care Physician:  Venia Carbon, MD Primary Gastroenterologist:  None, previous GI: Leda [facility]ze [facility] [place].      Reason for Consultation:  Anemia.  Abnormal gastric imaging.     HPI: Dylan Kaufman is a 69 y.o. male.  PMH Hep C type 1b, eradicated w Harvoni.  Htn.  PAD on Pletal.  Erectile dysfunction following removal of lipoma from his groin in 2018.  Still smoking ~ 5 to 7 cigs/day.  Anemia on po iron.  Surgeries include appy and prostate surgery.   06/2014 Colonoscopy in [place].  Pt reports unremarkable findings.  Unable to pull up procedure report or any pathology..  02/04/2019 CTAP for severe abd pain in [place]: Circumferential wall thickening of the body of the stomach, which may be related to gastritis.  Mild gastrohepatic ligament lymphadenopathy and several prominent lymph nodes in the mesentery adjacent to the distal stomach, which may be reactive.  Stool throughout colon suggestive of constipation. Mild prostatomegaly. Liver parenchyma normal,  sub cm lesion in liver, TSTC. Mild gastrohepatic ligament lymphadenopathy measuring up to 1.2 cm in short axis. There also several prominent lymph nodes in the mesentery adjacent to the distal stomach (series 2, image 40). No other abdominal or pelvic lymphadenopathy.     Pt recently relocated form [place].  Initial  OV with PMD on 06/15/19.  C/o of abd pain, lower abd burning, nausea and occ emesis, constipation.  PMD Rxd  Omeprazole and Miralax, smoking cessation. Alk phos 151, lipase 135.  t bili and transaminases ok.   07/16/19 ROV: persistent abd pain L flank, increased with arm raising.  Noted the elevated Lipase and alk phos of  previous visit.  Added FODMAP diet guidelines, fecal immunochemical test, GI eval. patient has not completed the fit test Several months of intermittent severe lower abdominal pain.  Anorexia, early satiety, abdominal distention.  Chronic constipation..  Weight in May was 121 #, 127 #during June visit.    Presented to ED today with worsening constipation.  Had not had a bowel movement for 4 days, persistent lower abdominal abdominal pain.  In recent weeks had successfully relieved constipation with magnesium citrate. Heart rate to 121.  Blood pressures 120s -130s/60s -70s.  Resps into the 30s.     Hgb 5.9, MCV 88.  Platelets 74 K.  INR 1.7.  FOBT negative. Alk phos now 1170.  Lipase 31. AST 65.  Normal ALT and T bili.   K 3.3, BUN 36, normal but rising creat at 0.95. CTAP w contrast: Numerous sclerotic lesions in the spine, worrisome for mets.  Prominent antral mucosa and gastrohepatic ligament adenopathy suspicious for gastric primary.  Large volume ascites w loculation.  Probable peritoneal thickening.  Bilateral LL ATX with bronchial plugging and trace left effusion severe aortic atherosclerosis.  Retired Garment/textile technologist.  Married. Quit alcohol many years ago.  Family history negative for gastric or colon cancer and diseases.  Past Medical History:  Diagnosis Date  . Hepatitis C infection    treated with harvoni  . Hypertension   . PAD (peripheral artery disease) ([place])     Past Surgical History:  Procedure Laterality Date  . LIPOMA EXCISION  06/2016   in left groin--- ED since then    Prior to Admission medications   Medication Sig Start Date End Date Taking? Authorizing Provider  amLODipine (NORVASC) 10 MG tablet Take 10 mg by mouth daily.   Yes [provider]  cilostazol (PLETAL) 50 MG tablet Take 50 mg by mouth 2 (two) times daily.   Yes [provider]  ferrous sulfate 325 (65 FE) MG tablet Take 325 mg by mouth daily with breakfast.   Yes [provider]  Multiple Vitamins-Minerals (CENTRUM SILVER 50+MEN PO) Take 1 tablet by mouth daily.   Yes [provider]    Scheduled Meds: . pantoprazole (PROTONIX) IV  40 mg Intravenous Q12H  . potassium chloride  40 mEq Oral Q2H  . sodium chloride flush  3 mL Intravenous Q12H   Infusions: . sodium chloride     PRN Meds: albuterol, bisacodyl, ondansetron **OR** ondansetron (ZOFRAN) IV, polyethylene glycol   Allergies as of 07/22/2019  . (No Known Allergies)    Family History  Problem Relation Age of Onset  . HIV/AIDS Brother     Social History   Socioeconomic History  . Marital status: Married    Spouse name: Not on file  . Number of children: 3  . Years of education: Not on file  . Highest education level: Not on file  Occupational History  . 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 Smoking status: Current Every Day Smoker    Start date: 74  . Smokeless tobacco: Never Used  Substance and Sexual Activity  . Alcohol use: Yes    Comment: occ beer  . Drug use: Not on file  . Sexual activity: Not on file  Other Topics Concern  . Not on file  Social History Narrative  . Not on file   Social Determinants of Health   Financial Resource Strain:   . Difficulty of Paying Living Expenses:   Food Insecurity:   . Worried About Charity fundraiser in the Last Year:   . Arboriculturist in the Last Year:   Transportation Needs:   . Film/video editor (Medical):   Marland Kitchen Lack of Transportation (Non-Medical):   Physical Activity:   . Days of Exercise per Week:   . Minutes of Exercise per Session:   Stress:   . Feeling of Stress :   Social Connections:   . Frequency of Communication with Friends and Family:   . Frequency of Social Gatherings with Friends and Family:   . Attends Religious Services:   . Active Member of Clubs or Organizations:   . Attends Archivist Meetings:   Marland Kitchen Marital Status:     Intimate Partner Violence:   . Fear of Current or Ex-Partner:   . Emotionally Abused:   Marland Kitchen Physically Abused:   . Sexually Abused:     REVIEW OF SYSTEMS: Constitutional: Some fatigue, not profound. ENT:  No nose bleeds Pulm: Denies shortness of breath and cough. CV:  No palpitations, no LE edema.  No angina GU:  No hematuria, no frequency.  Urinary incontinence.  Erectile dysfunction GI: Constipation as per HPI.  No vomiting or nausea Heme: No unusual or  excessive bleeding or bruising Transfusions: None Neuro: Loss of sensation in both of his feet no headaches.  No syncope, no seizures. Derm:  No itching, no rash or sores.  Endocrine:  No sweats or chills.  No polyuria or dysuria Immunization: Not queried Travel:  None beyond local counties in last few months.    PHYSICAL EXAM: Vital signs in last 24 hours: Vitals:   08/02/19 0815 08/02/19 0835  BP: 118/68 111/84  Pulse: (!) 116 (!) 112  Resp: 20 (!) 24  Temp: 98.6 F (37 C) 99.2 F (37.3 C)  SpO2: 95%    Wt Readings from Last 3 Encounters:  07/19/2019 57.6 kg  07/16/19 57.6 kg  06/15/19 54.9 kg    General: Thin, somewhat unwell, somewhat uncomfortable appearing.  Alert. Head: No facial asymmetry or swelling.  No signs of head trauma. Eyes: No scleral icterus.  No conjunctival pallor. Ears: Not hard of hearing. Nose: No discharge or congestion Mouth: Oral mucosa is moist, pink, clear.  Tongue midline. Neck: No JVD, no masses, no thyromegaly Lungs: Clear bilaterally.  No adventitious sounds.  No labored breathing or cough Heart: RRR, slightly tacky.  No MRG.  S1, S2 present Abdomen: Tenderness in all 4 quadrants but more prominent in lower abdomen bilaterally.  No guarding or rebound.  Do not appreciate organomegaly, masses, bruits, hernias.   Rectal: Deferred Musc/Skeltl: No joint redness, swelling or gross deformities. Extremities:  Leg slightly thin, no lower extremity edema  Neurologic: Alert.  Oriented x3.   Very detailed historian, refers back several years.  No tremors.  No obvious deficits or weakness.  Moves all 4 limbs. Skin: No rash, no sores, no telangiectasia. Nodes: No cervical adenopathy Psych: Somewhat anxious, cooperative, fluid speech.  Intake/Output from previous day: 06/27 0701 - 06/28 0700 In: 1000 [IV Piggyback:1000] Out: -  Intake/Output this shift: No intake/output data recorded.  LAB RESULTS: Recent Labs    08/02/19 0242  WBC 13.3*  HGB 5.9*  HCT 18.3*  PLT 74*   BMET Lab Results  Component Value Date   NA 139 08/02/2019   NA 138 07/21/2019   NA 138 06/15/2019   K 3.3 (L) 08/02/2019   K 4.0 07/21/2019   K 4.0 06/15/2019   CL 100 08/02/2019   CL 101 07/21/2019   CL 99 06/15/2019   CO2 23 08/02/2019   CO2 23 07/21/2019   CO2 33 (H) 06/15/2019   GLUCOSE 150 (H) 08/02/2019   GLUCOSE 112 (H) 07/21/2019   GLUCOSE 102 (H) 06/15/2019   BUN 36 (H) 08/02/2019   BUN 17 07/21/2019   BUN 11 06/15/2019   CREATININE 0.95 08/02/2019   CREATININE 0.78 07/21/2019   CREATININE 0.74 06/15/2019   CALCIUM 8.7 (L) 08/02/2019   CALCIUM 9.5 07/21/2019   CALCIUM 9.6 06/15/2019   LFT Recent Labs    08/02/19 0242  PROT 6.8  ALBUMIN 3.1*  AST 65*  ALT 30  ALKPHOS 1,170*  BILITOT 0.7   PT/INR Lab Results  Component Value Date   INR 1.7 (H) 08/02/2019   Hepatitis Panel No results for input(s): HEPBSAG, HCVAB, HEPAIGM, HEPBIGM in the last 72 hours. C-Diff No components found for: CDIFF Lipase     Component Value Date/Time   LIPASE 31 08/02/2019 0242    Drugs of Abuse  No results found for: LABOPIA, COCAINSCRNUR, LABBENZ, AMPHETMU, THCU, LABBARB   RADIOLOGY STUDIES: CT Angio Chest PE W and/or Wo Contrast  Result Date: 08/02/2019 CLINICAL DATA:  Chest pain and shortness  of breath.  Constipation EXAM: CT ANGIOGRAPHY CHEST CT ABDOMEN AND PELVIS WITH CONTRAST TECHNIQUE: Multidetector CT imaging of the chest was performed using the standard protocol during  bolus administration of intravenous contrast. Multiplanar CT image reconstructions and MIPs were obtained to evaluate the vascular anatomy. Multidetector CT imaging of the abdomen and pelvis was performed using the standard protocol during bolus administration of intravenous contrast. CONTRAST:  148m OMNIPAQUE IOHEXOL 350 MG/ML SOLN COMPARISON:  None. FINDINGS: CTA CHEST FINDINGS Cardiovascular: Normal heart size. No pericardial effusion. There may be hypertrophy of the left ventricle. Heavily calcified aorta, great vessels, and coronary arteries. 40% narrowing of the brachiocephalic artery on coronal reformats. Assessment of the left common carotid atherosclerotic narrowing is limited by streak artifact. That no pulmonary artery filling defect. Mediastinum/Nodes: Negative for adenopathy or mass Lungs/Pleura: Airway plugging in the left lower lobe. Bilateral lower lobe atelectasis and small volume left pleural effusion. Musculoskeletal: Patchy sclerosis in the sternum and throughout the spine primarily worrisome for metastatic disease. Remote bilateral rib fractures. T7 superior endplate deformity without visible acute fracture line. Review of the MIP images confirms the above findings. CT ABDOMEN and PELVIS FINDINGS Hepatobiliary: Noted history of hepatitis-C. No overt cirrhotic changes. Tiny cystic density in the central liver.No evidence of biliary obstruction or stone. Pancreas: Unremarkable. Spleen: Unremarkable. Adrenals/Urinary Tract: Negative adrenals. No hydronephrosis or stone. Right upper pole cortical scarring. Distended urinary bladder without focal abnormality. Stomach/Bowel: No obstruction or visible inflammation. Prominent enhancement and redundant appearance to the gastric antral mucosa. Moderate stool retention. Vascular/Lymphatic: No acute vascular abnormality. Heavily calcified aorta and iliacs with advanced bilateral iliac and common femoral/superficial femoral artery narrowings. No mass or  adenopathy. Reproductive:Probable TURP defect. Other: Large volume ascites which does not appear equally distributed throughout the abdomen. There may be some peritoneal thickening and smooth linear enhancement, no discrete peritoneal nodule. Adenopathy in the gastrohepatic ligament measuring up to 13 mm short axis. Musculoskeletal: Patchy sclerosis in the lumbar spine. There are T12, L1, L2, and L3 superior endplate deformities, age-indeterminate. Review of the MIP images confirms the above findings. IMPRESSION: Abdominal CT: 1. Numerous sclerotic foci in the spine worrisome for metastatic disease. Prominent intrathecal enhancement, suggest MR follow-up if there is myelopathy or polyneuropathy. There are multiple superior endplate fractures, age-indeterminate. 2. A gastric primary is considered given the prominent antral mucosa and adenopathy in the gastrohepatic ligament. 3. Large volume ascites. No discrete peritoneal nodule, but there is some loculation and probable peritoneal thickening, suggest paracentesis with cytology. 4. Severe atherosclerosis. Chest CTA: 1. Negative for pulmonary embolism. 2. Bilateral lower lobe atelectasis with bronchial plugging and trace effusion on the left. 3. Severe atherosclerosis. Electronically Signed   By: JMonte FantasiaM.D.   On: 08/02/2019 04:43   CT ABDOMEN PELVIS W CONTRAST  Result Date: 08/02/2019 CLINICAL DATA:  Chest pain and shortness of breath.  Constipation EXAM: CT ANGIOGRAPHY CHEST CT ABDOMEN AND PELVIS WITH CONTRAST TECHNIQUE: Multidetector CT imaging of the chest was performed using the standard protocol during bolus administration of intravenous contrast. Multiplanar CT image reconstructions and MIPs were obtained to evaluate the vascular anatomy. Multidetector CT imaging of the abdomen and pelvis was performed using the standard protocol during bolus administration of intravenous contrast. CONTRAST:  1030mOMNIPAQUE IOHEXOL 350 MG/ML SOLN COMPARISON:   None. FINDINGS: CTA CHEST FINDINGS Cardiovascular: Normal heart size. No pericardial effusion. There may be hypertrophy of the left ventricle. Heavily calcified aorta, great vessels, and coronary arteries. 40% narrowing of the brachiocephalic artery on coronal  reformats. Assessment of the left common carotid atherosclerotic narrowing is limited by streak artifact. That no pulmonary artery filling defect. Mediastinum/Nodes: Negative for adenopathy or mass Lungs/Pleura: Airway plugging in the left lower lobe. Bilateral lower lobe atelectasis and small volume left pleural effusion. Musculoskeletal: Patchy sclerosis in the sternum and throughout the spine primarily worrisome for metastatic disease. Remote bilateral rib fractures. T7 superior endplate deformity without visible acute fracture line. Review of the MIP images confirms the above findings. CT ABDOMEN and PELVIS FINDINGS Hepatobiliary: Noted history of hepatitis-C. No overt cirrhotic changes. Tiny cystic density in the central liver.No evidence of biliary obstruction or stone. Pancreas: Unremarkable. Spleen: Unremarkable. Adrenals/Urinary Tract: Negative adrenals. No hydronephrosis or stone. Right upper pole cortical scarring. Distended urinary bladder without focal abnormality. Stomach/Bowel: No obstruction or visible inflammation. Prominent enhancement and redundant appearance to the gastric antral mucosa. Moderate stool retention. Vascular/Lymphatic: No acute vascular abnormality. Heavily calcified aorta and iliacs with advanced bilateral iliac and common femoral/superficial femoral artery narrowings. No mass or adenopathy. Reproductive:Probable TURP defect. Other: Large volume ascites which does not appear equally distributed throughout the abdomen. There may be some peritoneal thickening and smooth linear enhancement, no discrete peritoneal nodule. Adenopathy in the gastrohepatic ligament measuring up to 13 mm short axis. Musculoskeletal: Patchy sclerosis  in the lumbar spine. There are T12, L1, L2, and L3 superior endplate deformities, age-indeterminate. Review of the MIP images confirms the above findings. IMPRESSION: Abdominal CT: 1. Numerous sclerotic foci in the spine worrisome for metastatic disease. Prominent intrathecal enhancement, suggest MR follow-up if there is myelopathy or polyneuropathy. There are multiple superior endplate fractures, age-indeterminate. 2. A gastric primary is considered given the prominent antral mucosa and adenopathy in the gastrohepatic ligament. 3. Large volume ascites. No discrete peritoneal nodule, but there is some loculation and probable peritoneal thickening, suggest paracentesis with cytology. 4. Severe atherosclerosis. Chest CTA: 1. Negative for pulmonary embolism. 2. Bilateral lower lobe atelectasis with bronchial plugging and trace effusion on the left. 3. Severe atherosclerosis. Electronically Signed   By: Monte Fantasia M.D.   On: 08/02/2019 04:43      IMPRESSION:   *   Spinal mets, ? Gastric source pndg are MR brain, spine.    *   Hx Hep C, completed Harvoni.  Undetectable HCV RNA 05/2016.  Elevated alk phos due to spinal mets,  AST slightly elevated but no cirrhosis on CT.    *    Elevated troponins.  31 .. 261. May be demand ischemia.  Patient not having angina.  *    Acute on chronic constipation.    PLAN:     *   EGD.  Set for 8 AM tmrw.  However with the elevation of his troponin just now noted, may need to delay the EGD until cardiology weighs in. Soft diet now, n.p.o. after midnight.  *    Transfuse.  1 of 3 units has been completed. Follow-up CBC planned for AM  *    Continue Protonix 40 IV q 12 hours for now  *    Paracentesis and send fluid for cell count, differential, albumin, cytology.  Hospitalist has ordered.  *    Change as needed MiraLAX to twice daily scheduled.  Give 10 mg Dulcolax now and continue as needed 5 mg Dulcolax   Azucena Freed  08/02/2019, 10:56 AM Phone  [phone]    Attending physician's note   I have taken a history, examined the patient and reviewed the chart. I agree with  the Advanced Practitioner's note, impression and recommendations.  69 year old male with history of chronic hep C s/p treatment with SVR, peripheral vascular disease on Pletal admitted with severe anemia  CT abdomen pelvis December 2020 with circumferential wall thickening of the body of stomach    CT abdomen pelvis with contrast during this admission showed numerous sclerotic lesions in the spine concerning for mets, prominent antral mucosal thickening with gastrohepatic ligament adenopathy suspicious for primary gastric tumor.  EGD tomorrow a.m. The risks and benefits as well as alternatives of endoscopic procedure(s) have been discussed and reviewed. All questions answered. The patient agrees to proceed.  Await diagnostic paracentesis and cytology  Monitor hemoglobin and transfuse to greater than 7   K. Denzil Magnuson , MD [phone]

## 2019-08-02 NOTE — Consult Note (Addendum)
Reason for Consult: Back pain lower extremity weakness Referring Physician: Emergency department  Dylan Kaufman is an 69 y.o. male.  HPI: 69 year old gentleman with back pain bilateral lower extremity weakness episode of incontinence of bladder last Thursday and constipation for a week has been treated on and off for back pain and abdominal pain over the last several weeks.  However presented to the ER today after having 3 days of weakness and his wife reports that he has not moved his legs really since Friday he has been nonambulatory since Friday.  Due to the patient's lethargy having recently received sedating medications I do not have a good exam.  Past history is significant for peripheral artery disease he is on antiplatelet therapy for this hepatitis C infection and hypertension.  He is a smoker  Past Medical History:  Diagnosis Date  . Hepatitis C infection    treated with harvoni  . Hypertension   . PAD (peripheral artery disease) ([place])     Past Surgical History:  Procedure Laterality Date  . LIPOMA EXCISION  06/2016   in left groin--- ED since then    Family History  Problem Relation Age of Onset  . HIV/AIDS Brother     Social History:  reports that he has been smoking. He started smoking about 56 years ago. He has never used smokeless tobacco. He reports current alcohol use. No history on file for drug use.  Allergies: No Known Allergies  Medications: I have reviewed the patient's current medications.  Results for orders placed or performed during the hospital encounter of 07/07/2019 (from the past 48 hour(s))  Lipase, blood     Status: None   Collection Time: 08/02/19  2:42 AM  Result Value Ref Range   Lipase 31 11 - 51 U/L    Comment: Performed at [facility], [place] [address], [place], [place] [address]  Comprehensive metabolic panel     Status: Abnormal   Collection Time: 08/02/19  2:42 AM  Result Value Ref Range   Sodium 139 135 - 145 mmol/L   Potassium 3.3  (L) 3.5 - 5.1 mmol/L   Chloride 100 98 - 111 mmol/L   CO2 23 22 - 32 mmol/L   Glucose, Bld 150 (H) 70 - 99 mg/dL    Comment: Glucose reference range applies only to samples taken after fasting for at least 8 hours.   BUN 36 (H) 8 - 23 mg/dL   Creatinine, Ser 0.95 0.61 - 1.24 mg/dL   Calcium 8.7 (L) 8.9 - 10.3 mg/dL   Total Protein 6.8 6.5 - 8.1 g/dL   Albumin 3.1 (L) 3.5 - 5.0 g/dL   AST 65 (H) 15 - 41 U/L   ALT 30 0 - 44 U/L   Alkaline Phosphatase 1,170 (H) 38 - 126 U/L   Total Bilirubin 0.7 0.3 - 1.2 mg/dL   GFR calc non Af Amer >60 >60 mL/min   GFR calc Af Amer >60 >60 mL/min   Anion gap 16 (H) 5 - 15    Comment: Performed at [facility] [address] [place] [address]  CBC     Status: Abnormal   Collection Time: 08/02/19  2:42 AM  Result Value Ref Range   WBC 13.3 (H) 4.0 - 10.5 K/uL   RBC 2.06 (L) 4.22 - 5.81 MIL/uL   Hemoglobin 5.9 (LL) 13.0 - 17.0 g/dL    Comment: REPEATED TO VERIFY THIS CRITICAL RESULT HAS VERIFIED AND BEEN CALLED TO ANNA  JASPER RN BY MARSHA GARRETT ON 06 28 2021 AT [id number], AND HAS BEEN READ BACK.     HCT 18.3 (L) 39 - 52 %   MCV 88.8 80.0 - 100.0 fL   MCH 28.6 26.0 - 34.0 pg   MCHC 32.2 30.0 - 36.0 g/dL   RDW 14.3 11.5 - 15.5 %   Platelets 74 (L) 150 - 400 K/uL    Comment: Immature Platelet Fraction may be clinically indicated, consider ordering this additional test [id number] REPEATED TO VERIFY    nRBC 26.5 (H) 0.0 - 0.2 %    Comment: Performed at [facility] [address] [place], [place] [address]  Protime-INR     Status: Abnormal   Collection Time: 08/02/19  4:21 AM  Result Value Ref Range   Prothrombin Time 19.1 (H) 11.4 - 15.2 seconds   INR 1.7 (H) 0.8 - 1.2    Comment: (NOTE) INR goal varies based on device and disease states. Performed at [facility], [place] [address], [place], [place] [address]   Lactic acid, plasma     Status: Abnormal   Collection Time: 08/02/19  4:21 AM  Result Value Ref Range    Lactic Acid, Venous 2.0 (HH) 0.5 - 1.9 mmol/L    Comment: CRITICAL RESULT CALLED TO, READ BACK BY AND VERIFIED WITH: RN H JASPER @0538  08/02/19 BY S GEZAHEGN Performed at [facility], [place] [address], [place], [place] [address]   Troponin I (High Sensitivity)     Status: Abnormal   Collection Time: 08/02/19  4:21 AM  Result Value Ref Range   Troponin I (High Sensitivity) 31 (H) <18 ng/L    Comment: (NOTE) Elevated high sensitivity troponin I (hsTnI) values and significant  changes across serial measurements may suggest ACS but many other  chronic and acute conditions are known to elevate hsTnI results.  Refer to the "Links" section for chest pain algorithms and additional  guidance. Performed at [facility], [place] [address], [place], [place] [address]   APTT     Status: Abnormal   Collection Time: 08/02/19  4:29 AM  Result Value Ref Range   aPTT 47 (H) 24 - 36 seconds    Comment:        IF BASELINE aPTT IS ELEVATED, SUGGEST PATIENT RISK ASSESSMENT BE USED TO DETERMINE APPROPRIATE ANTICOAGULANT THERAPY. Performed at [facility], [place] [address], [place], [place] [address]   Type and screen [place]     Status: None (Preliminary result)   Collection Time: 08/02/19  4:57 AM  Result Value Ref Range   ABO/RH(D) AB POS    Antibody Screen NEG    Sample Expiration 08/23/2019,2359    Unit Number [id number]    Blood Component Type RED CELLS,LR    Unit division 00    Status of Unit ISSUED    Transfusion Status OK TO TRANSFUSE    Crossmatch Result      Compatible Performed at [facility], [place] [address], [place], [place] [address]    Unit Number [id number]    Blood Component Type RED CELLS,LR    Unit division 00    Status of Unit ALLOCATED    Transfusion Status OK TO TRANSFUSE    Crossmatch Result Compatible    Unit Number [id number]    Blood Component Type RED CELLS,LR    Unit division 00    Status of Unit ALLOCATED     Transfusion  Status OK TO TRANSFUSE    Crossmatch Result Compatible   Prepare RBC (crossmatch)     Status: None   Collection Time: 08/02/19  4:57 AM  Result Value Ref Range   Order Confirmation      ORDER PROCESSED BY BLOOD BANK Performed at [facility], [place] [address], [place], [place] [address]   ABO/Rh     Status: None   Collection Time: 08/02/19  4:57 AM  Result Value Ref Range   ABO/RH(D)      AB POS Performed at [facility] [address], [place], [place] [address]   POC occult blood, ED     Status: None   Collection Time: 08/02/19  5:16 AM  Result Value Ref Range   Fecal Occult Bld NEGATIVE NEGATIVE  SARS Coronavirus 2 by RT PCR (hospital order, performed in [facility] hospital lab) Nasopharyngeal Nasopharyngeal Swab     Status: None   Collection Time: 08/02/19  5:52 AM   Specimen: Nasopharyngeal Swab  Result Value Ref Range   SARS Coronavirus 2 NEGATIVE NEGATIVE    Comment: (NOTE) SARS-CoV-2 target nucleic acids are NOT DETECTED.  The SARS-CoV-2 RNA is generally detectable in upper and lower respiratory specimens during the acute phase of infection. The lowest concentration of SARS-CoV-2 viral copies this assay can detect is 250 copies / mL. A negative result does not preclude SARS-CoV-2 infection and should not be used as the sole basis for treatment or other patient management decisions.  A negative result may occur with improper specimen collection / handling, submission of specimen other than nasopharyngeal swab, presence of viral mutation(s) within the areas targeted by this assay, and inadequate number of viral copies (<250 copies / mL). A negative result must be combined with clinical observations, patient history, and epidemiological information.  Fact Sheet for Patients:   StrictlyIdeas.no  Fact Sheet for Healthcare Providers: BankingDealers.co.za  This test is not yet approved or  cleared by  the [place] FDA and has been authorized for detection and/or diagnosis of SARS-CoV-2 by FDA under an Emergency Use Authorization (EUA).  This EUA will remain in effect (meaning this test can be used) for the duration of the COVID-19 declaration under Section 564(b)(1) of the Act, 21 U.S.C. section 360bbb-3(b)(1), unless the authorization is terminated or revoked sooner.  Performed at [facility], [place] [address], [place], [place] [address]   CBG monitoring, ED     Status: Abnormal   Collection Time: 08/02/19  7:56 AM  Result Value Ref Range   Glucose-Capillary 126 (H) 70 - 99 mg/dL    Comment: Glucose reference range applies only to samples taken after fasting for at least 8 hours.  Troponin I (High Sensitivity)     Status: Abnormal   Collection Time: 08/02/19  8:01 AM  Result Value Ref Range   Troponin I (High Sensitivity) 261 (HH) <18 ng/L    Comment: CRITICAL RESULT CALLED TO, READ BACK BY AND VERIFIED WITHRhona Leavens RN [phone] BY A BENNETT (NOTE) Elevated high sensitivity troponin I (hsTnI) values and significant  changes across serial measurements may suggest ACS but many other  chronic and acute conditions are known to elevate hsTnI results.  Refer to the Links section for chest pain algorithms and additional  guidance. Performed at [facility], [place] [address], [place],  [address]   HIV Antibody (routine testing w rflx)     Status: None   Collection Time: 08/02/19  8:01 AM  Result Value Ref Range   HIV Screen 4th Generation wRfx Non Reactive Non Reactive    Comment: Performed at [facility], [place] [address], [place], [place] [address]  Lactic acid, plasma     Status: None   Collection Time: 08/02/19  8:15 AM  Result Value Ref Range   Lactic Acid, Venous 1.2 0.5 - 1.9 mmol/L    Comment: Performed at [facility] [address] [place], [place] [address]  Hemoglobin and hematocrit, blood     Status: Abnormal   Collection Time:  08/02/19  3:00 PM  Result Value Ref Range   Hemoglobin 8.5 (L) 13.0 - 17.0 g/dL    Comment: REPEATED TO VERIFY POST TRANSFUSION SPECIMEN    HCT 26.2 (L) 39 - 52 %    Comment: REPEATED TO VERIFY Performed at [facility], [place] [address], [place], [place] [address]     CT Angio Chest PE W and/or Wo Contrast  Result Date: 08/02/2019 CLINICAL DATA:  Chest pain and shortness of breath.  Constipation EXAM: CT ANGIOGRAPHY CHEST CT ABDOMEN AND PELVIS WITH CONTRAST TECHNIQUE: Multidetector CT imaging of the chest was performed using the standard protocol during bolus administration of intravenous contrast. Multiplanar CT image reconstructions and MIPs were obtained to evaluate the vascular anatomy. Multidetector CT imaging of the abdomen and pelvis was performed using the standard protocol during bolus administration of intravenous contrast. CONTRAST:  164m OMNIPAQUE IOHEXOL 350 MG/ML SOLN COMPARISON:  None. FINDINGS: CTA CHEST FINDINGS Cardiovascular: Normal heart size. No pericardial effusion. There may be hypertrophy of the left ventricle. Heavily calcified aorta, great vessels, and coronary arteries. 40% narrowing of the brachiocephalic artery on coronal reformats. Assessment of the left common carotid atherosclerotic narrowing is limited by streak artifact. That no pulmonary artery filling defect. Mediastinum/Nodes: Negative for adenopathy or mass Lungs/Pleura: Airway plugging in the left lower lobe. Bilateral lower lobe atelectasis and small volume left pleural effusion. Musculoskeletal: Patchy sclerosis in the sternum and throughout the spine primarily worrisome for metastatic disease. Remote bilateral rib fractures. T7 superior endplate deformity without visible acute fracture line. Review of the MIP images confirms the above findings. CT ABDOMEN and PELVIS FINDINGS Hepatobiliary: Noted history of hepatitis-C. No overt cirrhotic changes. Tiny cystic density in the central liver.No evidence of  biliary obstruction or stone. Pancreas: Unremarkable. Spleen: Unremarkable. Adrenals/Urinary Tract: Negative adrenals. No hydronephrosis or stone. Right upper pole cortical scarring. Distended urinary bladder without focal abnormality. Stomach/Bowel: No obstruction or visible inflammation. Prominent enhancement and redundant appearance to the gastric antral mucosa. Moderate stool retention. Vascular/Lymphatic: No acute vascular abnormality. Heavily calcified aorta and iliacs with advanced bilateral iliac and common femoral/superficial femoral artery narrowings. No mass or adenopathy. Reproductive:Probable TURP defect. Other: Large volume ascites which does not appear equally distributed throughout the abdomen. There may be some peritoneal thickening and smooth linear enhancement, no discrete peritoneal nodule. Adenopathy in the gastrohepatic ligament measuring up to 13 mm short axis. Musculoskeletal: Patchy sclerosis in the lumbar spine. There are T12, L1, L2, and L3 superior endplate deformities, age-indeterminate. Review of the MIP images confirms the above findings. IMPRESSION: Abdominal CT: 1. Numerous sclerotic foci in the spine worrisome for metastatic disease. Prominent intrathecal enhancement, suggest MR follow-up if there is myelopathy or polyneuropathy. There are multiple superior endplate fractures, age-indeterminate. 2. A gastric primary is considered given the prominent antral mucosa and adenopathy in the gastrohepatic ligament. 3. Large volume ascites. No discrete peritoneal nodule, but there is some  loculation and probable peritoneal thickening, suggest paracentesis with cytology. 4. Severe atherosclerosis. Chest CTA: 1. Negative for pulmonary embolism. 2. Bilateral lower lobe atelectasis with bronchial plugging and trace effusion on the left. 3. Severe atherosclerosis. Electronically Signed   By: Monte Fantasia M.D.   On: 08/02/2019 04:43   MR BRAIN W WO CONTRAST  Result Date:  08/02/2019 CLINICAL DATA:  69 year old male with evidence of spinal metastatic disease on CT Chest, Abdomen, and Pelvis today. EXAM: MRI HEAD WITHOUT AND WITH CONTRAST TECHNIQUE: Multiplanar, multiecho pulse sequences of the brain and surrounding structures were obtained without and with intravenous contrast. CONTRAST:  59m GADAVIST GADOBUTROL 1 MMOL/ML IV SOLN COMPARISON:  No prior brain imaging. FINDINGS: Brain: There is subtle abnormal diffusion in white matter near the left splenium of the corpus callosum (series 7 image 42). Mild T2 and FLAIR hyperintensity is associated, with scattered additional patchy white matter T2 and FLAIR hyperintensity elsewhere. Similar T2 and FLAIR hyperintensity in the left basal ganglia compatible with chronic lacunar infarct. No other abnormal diffusion. No cortical encephalomalacia identified. There is a chronic microhemorrhage in the left thalamus. Questionable similar chronic microhemorrhage in the right deep cerebellar nuclei. Motion degraded postcontrast imaging throughout the brain. No definite abnormal parenchymal enhancement. No midline shift, mass effect, ventriculomegaly, extra-axial collection or acute intracranial hemorrhage. Cervicomedullary junction and pituitary are within normal limits. Vascular: Major intracranial vascular flow voids are preserved. Postcontrast images are motion degraded but the superior sagittal sinus, transverse and sigmoid sinuses appear to be grossly patent and enhancing. Skull and upper cervical spine: Abnormal marrow signal in the occipital condyles and throughout the visible cervical spine. Superimposed discrete and expansile bone mass at the left lower temporal bone (series 9, image 18, series 5, image 52. Associated mild left mastoid effusion. No other destructive skull lesion identified. Sinuses/Orbits: Negative. Other: Right mastoids remain clear.  No scalp soft tissue lesion. IMPRESSION: 1. 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 Mild associated left mastoid effusion. 2. Substantially degraded postcontrast images of the brain with no obvious brain metastasis. A repeat staging Brain MRI when the patient can better cooperate would be valuable. 3. Evidence of small vessel disease with possible recent white matter ischemia near the left splenium. Electronically Signed   By: HGenevie AnnM.D.   On: 08/02/2019 14:22   MR THORACIC SPINE W WO CONTRAST  Result Date: 08/02/2019 CLINICAL DATA:  Back pain. Abnormal spine on CT. Chronic hepatitis C infection. EXAM: MRI THORACIC WITHOUT AND WITH CONTRAST TECHNIQUE: Multiplanar and multiecho pulse sequences of the thoracic spine were obtained without and with intravenous contrast. CONTRAST:  665mGADAVIST GADOBUTROL 1 MMOL/ML IV SOLN COMPARISON:  CT chest abdomen pelvis earlier today. FINDINGS: MRI THORACIC SPINE FINDINGS Alignment:  Normal Vertebrae: Diffusely abnormal bone marrow throughout the thoracic spine with diffuse low signal intensity suggesting a diffuse infiltrative process. Scattered sclerotic lesions are present at several levels on CT. Mild compression fracture of T5 with bone marrow edema. Mild compression fractures of T7, T8, T9, T10 with bone marrow edema. Mild compression fracture T12 with mild bone marrow edema. There is heterogeneous enhancement of the bone marrow involving these fractures. Cord: There is spinal stenosis due to epidural soft tissue thickening from T6 through T10. This is causing cord flattening and mild cord compression. No cord signal abnormality is identified. Paraspinal and other soft tissues: Epidural soft tissue thickening and enhancement from T6 through  T10. There appears to be infiltration of the posterior epidural fat with enhancement. This is predominately posterior but appears circumferential. This is contributing to moderate spinal stenosis Disc levels: Negative for  disc protrusion.  Negative for disc space infection. IMPRESSION: Diffusely abnormal bone marrow throughout the thoracic spine suggesting an infiltrative process such as lymphoma, metastatic disease, or multiple myeloma. Multiple pathologic fractures that appear recent involving T5, T7, T9, T10, T12 Extensive epidural thickening and enhancement in the midthoracic spine causing compression of the spinal cord. 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 These results were called by telephone at the time of interpretation on 08/02/2019 at 2:48 pm to provider [place], who verbally acknowledged these results. Electronically Signed   By: Franchot Gallo M.D.   On: 08/02/2019 14:51   MR Lumbar Spine W Wo Contrast  Result Date: 08/02/2019 CLINICAL DATA:  Back pain.  Abnormal CT of the spine. EXAM: MRI LUMBAR SPINE WITHOUT AND WITH CONTRAST TECHNIQUE: Multiplanar and multiecho pulse sequences of the lumbar spine were obtained without and with intravenous contrast. CONTRAST:  7m GADAVIST GADOBUTROL 1 MMOL/ML IV SOLN COMPARISON:  CT  chest abdomen pelvis 08/02/2019 FINDINGS: Segmentation:  Normal Alignment:  Normal Vertebrae: Diffusely abnormal bone marrow which is markedly low signal on T1. Mild superior and inferior endplate fractures are present throughout the lumbar spine. There is mild edema involving the T12 and L2 fracture which may be recent. No focal mass lesion is identified. Conus medullaris and cauda equina: Conus extends to the L1-2 level. Conus and cauda equina appear normal. Paraspinal and other soft tissues: Moderate ascites. Liver and spleen are diffusely low signal. Patient has a history of chronic hepatitis C infection. No paraspinous adenopathy. Disc levels: L1-2: Negative L2-3: Negative L3-4: Small central disc protrusion.  Negative for stenosis L4-5: Small central disc protrusion.  Negative for stenosis L5-S1: Negative IMPRESSION: Diffusely abnormal bone marrow. The patient has severe anemia. No focal lesion identified.  Possible lymphoma or myeloproliferative disorder. Also consider widespread metastatic disease or myeloma. Consider bone marrow biopsy. Multiple mild compression fractures throughout the lumbar spine. There is mild edema at T12 and L2 suggesting recent fractures. These are mild. Moderate ascites. Diffusely low signal in the liver and spleen which may reflect hemochromatosis. Electronically Signed   By: CFranchot GalloM.D.   On: 08/02/2019 14:25   CT ABDOMEN PELVIS W CONTRAST  Result Date: 08/02/2019 CLINICAL DATA:  Chest pain and shortness of breath.  Constipation EXAM: CT ANGIOGRAPHY CHEST CT ABDOMEN AND PELVIS WITH CONTRAST TECHNIQUE: Multidetector CT imaging of the chest was performed using the standard protocol during bolus administration of intravenous contrast. Multiplanar CT image reconstructions and MIPs were obtained to evaluate the vascular anatomy. Multidetector CT imaging of the abdomen and pelvis was performed using the standard protocol during bolus administration of intravenous contrast. CONTRAST:  1087mOMNIPAQUE IOHEXOL 350 MG/ML SOLN COMPARISON:  None. FINDINGS: CTA CHEST FINDINGS Cardiovascular: Normal heart size. No pericardial effusion. There may be hypertrophy of the left ventricle. Heavily calcified aorta, great vessels, and coronary arteries. 40% narrowing of the brachiocephalic artery on coronal reformats. Assessment of the left common carotid atherosclerotic narrowing is limited by streak artifact. That no pulmonary artery filling defect. Mediastinum/Nodes: Negative for adenopathy or mass Lungs/Pleura: Airway plugging in the left lower lobe. Bilateral lower lobe atelectasis and small volume left pleural effusion. Musculoskeletal: Patchy sclerosis in the sternum and throughout the spine primarily worrisome for metastatic disease. Remote bilateral rib fractures. T7 superior endplate deformity without visible acute fracture line. Review of the  MIP images confirms the above findings. CT  ABDOMEN and PELVIS FINDINGS Hepatobiliary: Noted history of hepatitis-C. No overt cirrhotic changes. Tiny cystic density in the central liver.No evidence of biliary obstruction or stone. Pancreas: Unremarkable. Spleen: Unremarkable. Adrenals/Urinary Tract: Negative adrenals. No hydronephrosis or stone. Right upper pole cortical scarring. Distended urinary bladder without focal abnormality. Stomach/Bowel: No obstruction or visible inflammation. Prominent enhancement and redundant appearance to the gastric antral mucosa. Moderate stool retention. Vascular/Lymphatic: No acute vascular abnormality. Heavily calcified aorta and iliacs with advanced bilateral iliac and common femoral/superficial femoral artery narrowings. No mass or adenopathy. Reproductive:Probable TURP defect. Other: Large volume ascites which does not appear equally distributed throughout the abdomen. There may be some peritoneal thickening and smooth linear enhancement, no discrete peritoneal nodule. Adenopathy in the gastrohepatic ligament measuring up to 13 mm short axis. Musculoskeletal: Patchy sclerosis in the lumbar spine. There are T12, L1, L2, and L3 superior endplate deformities, age-indeterminate. Review of the MIP images confirms the above findings. IMPRESSION: Abdominal CT: 1. Numerous sclerotic foci in the spine worrisome for metastatic disease. Prominent intrathecal enhancement, suggest MR follow-up if there is myelopathy or polyneuropathy. There are multiple superior endplate fractures, age-indeterminate. 2. A gastric primary is considered given the prominent antral mucosa and adenopathy in the gastrohepatic ligament. 3. Large volume ascites. No discrete peritoneal nodule, but there is some loculation and probable peritoneal thickening, suggest paracentesis with cytology. 4. Severe atherosclerosis. Chest CTA: 1. Negative for pulmonary embolism. 2. Bilateral lower lobe atelectasis with bronchial plugging and trace effusion on the left. 3.  Severe atherosclerosis. Electronically Signed   By: Monte Fantasia M.D.   On: 08/02/2019 04:43   ECHOCARDIOGRAM COMPLETE  Result Date: 08/02/2019    ECHOCARDIOGRAM REPORT   Patient Name:   [facility] [place] Date of Exam: 08/02/2019 Medical Rec #:  [id number]    Height:       66.0 in Accession #:    [id number]   Weight:       127.0 lb Date of Birth:  [birth year]   BSA:          1.649 m Patient Age:    35 years     BP:           128/72 mmHg Patient Gender: M            HR:           104 bpm. Exam Location:  Inpatient Procedure: 2D Echo, Cardiac Doppler and Color Doppler Indications:    122-I22.9 Subsequent ST elevation (STEM) and non-ST elevation                 (NSTEMI) myocardial infarction  History:        Patient has no prior history of Echocardiogram examinations.                 Signs/Symptoms:Chest Pain and Dyspnea; Risk Factors:Current                 Smoker and Hypertension. HEP C.  Sonographer:    Roseanna Rainbow RDCS Referring Phys: [id number] [facility] A THOMAS  Sonographer Comments: Technically difficult study due to poor echo windows. Patient in pain and in supine position. Patient asked me to stop pressing on him in apical region. IMPRESSIONS  1. Left ventricular There is mild left ventricular hypertrophy. Apical window is forshortened. The LVEF is approximately 50 to 55% with apical hypokinesis.  2. Right ventricular systolic function is normal. The right ventricular size is normal.  3. The mitral  valve is normal in structure. Trivial mitral valve regurgitation.  4. The aortic valve is tricuspid. Aortic valve regurgitation is not visualized. Mild aortic valve sclerosis is present, with no evidence of aortic valve stenosis.  5. The inferior vena cava is normal in size with greater than 50% respiratory variability, suggesting right atrial pressure of 3 mmHg. FINDINGS  Left Ventricle: Left ventricular ejection fraction, by estimation, is 50 to 55%. The left ventricle has low normal function. The left ventricle  demonstrates regional wall motion abnormalities. The left ventricular internal cavity size was normal in size. There is mild left ventricular hypertrophy. Left ventricular diastolic parameters are consistent with Grade I diastolic dysfunction (impaired relaxation). Right Ventricle: The right ventricular size is normal. Right vetricular wall thickness was not assessed. Right ventricular systolic function is normal. Left Atrium: Left atrial size was normal in size. Right Atrium: Right atrial size was normal in size. Pericardium: There is no evidence of pericardial effusion. Mitral Valve: The mitral valve is normal in structure. Trivial mitral valve regurgitation. Tricuspid Valve: The tricuspid valve is normal in structure. Tricuspid valve regurgitation is trivial. Aortic Valve: The aortic valve is tricuspid. Aortic valve regurgitation is not visualized. Mild aortic valve sclerosis is present, with no evidence of aortic valve stenosis. Pulmonic Valve: The pulmonic valve was not well visualized. Pulmonic valve regurgitation is trivial. Aorta: The aortic root is normal in size and structure. Venous: The inferior vena cava is normal in size with greater than 50% respiratory variability, suggesting right atrial pressure of 3 mmHg. IAS/Shunts: No atrial level shunt detected by color flow Doppler.  LEFT VENTRICLE PLAX 2D LVIDd:         3.80 cm     Diastology LVIDs:         2.70 cm     LV e' lateral:   8.49 cm/s LV PW:         1.20 cm     LV E/e' lateral: 6.6 LV IVS:        1.10 cm     LV e' medial:    5.77 cm/s LVOT diam:     2.00 cm     LV E/e' medial:  9.7 LV SV:         62 LV SV Index:   38 LVOT Area:     3.14 cm  LV Volumes (MOD) LV vol d, MOD A2C: 67.0 ml LV vol d, MOD A4C: 71.4 ml LV vol s, MOD A2C: 28.6 ml LV vol s, MOD A4C: 38.2 ml LV SV MOD A2C:     38.4 ml LV SV MOD A4C:     71.4 ml LV SV MOD BP:      35.7 ml RIGHT VENTRICLE             IVC RV S prime:     18.20 cm/s  IVC diam: 1.50 cm TAPSE (M-mode): 2.3 cm LEFT  ATRIUM             Index       RIGHT ATRIUM          Index LA diam:        3.50 cm 2.12 cm/m  RA Area:     7.89 cm LA Vol (A2C):   28.1 ml 17.04 ml/m RA Volume:   14.00 ml 8.49 ml/m LA Vol (A4C):   16.2 ml 9.83 ml/m LA Biplane Vol: 22.3 ml 13.53 ml/m  AORTIC VALVE LVOT Vmax:   105.00 cm/s LVOT Vmean:  67.000 cm/s  LVOT VTI:    0.197 m  AORTA Ao Root diam: 3.00 cm Ao Asc diam:  3.20 cm MITRAL VALVE MV Area (PHT): 4.15 cm    SHUNTS MV Decel Time: 183 msec    Systemic VTI:  0.20 m MV E velocity: 55.70 cm/s  Systemic Diam: 2.00 cm MV A velocity: 99.00 cm/s MV E/A ratio:  0.56 Dorris Carnes MD Electronically signed by Dorris Carnes MD Signature Date/Time: 08/02/2019/2:54:24 PM    Final     Review of Systems  Unable to perform ROS: Mental status change   Blood pressure 122/83, pulse (!) 110, temperature (!) 97.3 F (36.3 C), temperature source Oral, resp. rate 20, height 5' 6"  (1.676 m), weight 57.6 kg, SpO2 100 %. Physical Exam Neurological:     Mental Status: He is lethargic.     GCS: GCS eye subscore is 4. GCS verbal subscore is 5. GCS motor subscore is 6.     Cranial Nerves: Cranial nerves are intact.     Comments: Patient is lethargic difficult to arouse however when he was aroused I could get him to answer some questions with great difficulty and follow some simple commands.  Upper extremity strength appears to be 5 out of 5 lower extremity appears to have flaccid paralysis I had 0 out of 5 strength iliopsoas quads hamstrings gastrocs and EHL bilaterally he does have some localization sensory intact reflexes are decreased bilaterally and symmetrically difficult to get sensory level.     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  Patient has lesions in his skull lesions and is cervical spine thoracic spine all consistent with metastatic disease.  CT scan abdomen pelvis raises a question of a  gastric primary secondary to prominent mucosal adenopathy in the gastrohepatic ligament with large volume ascites.  Patient appears to be pancytopenic with low white count hemoglobin of 5 low platelet count and mildly coagulopathic.  His thoracic MRI does show an epidural process possibly tumor could be hematoma that has mild cord compression it seems like his exam is much denser than can be accounted for by his cord compression I also question whether he has some signal change in his cord consistent with either a cord infarct or compression.  In addition with a dense paraparesis possible paraplegia that has been there since Friday the chances of a functional reversal of this is very very low.  However should he show signs of some increased motor function with resuscitation and steroids we can revisit that thought right now the patient is a very high risk for surgery would recommend holding all antiplatelet therapy try to reverse his coagulopathy increase his blood counts and continue work-up to identify the primary.  Agree with endoscopy also would send SPEP and UPEP to rule out multiple myeloma and question whether patient would be eligible for emergent radiation due to his overall general medical condition that may be better tolerated due to his high risk of surgery.  Elaina Hoops 08/02/2019, 3:51 PM   Addendum:  With review of contrasted images on Lumbar sequence does app[ear to be tumor more likely than hematoma.  Favor consideration of emergent radiation

## 2019-08-02 NOTE — ED Notes (Signed)
MD Matcha made aware of hgb 8.6 and HCT 26. Told to give only one additional unit of blood.

## 2019-08-02 NOTE — Consult Note (Signed)
Cardiology Consultation:   Patient ID: Fraser Busche MRN: [id number]; DOB: 06/20/1950  Admit date: 07/30/2019 Date of Consult: 08/02/2019  Primary Care Provider: Venia Carbon, MD [facility] Cardiologist: New - Dr. Stanford [facility] Electrophysiologist:  None    Patient Profile:   Osaze Hubbert is a 69 y.o. male with a hx of hepatitis C infection s/p Harvoni and HTN who is being seen today for the evaluation of elevated troponin and abnormal EKG at the request of Dr. Barth Kirks.  History of Present Illness:   Mr. Schoffstall is a 69 yo male with PMH of hepatitis C infection s/p Harvoni and HTN. Stress echo in Jan 2020 was normal. TTE obtained in Feb 2020 showed normal EF, mild LVH, normal RV, mild MR. Patient was recently seen in the ED on 07/21/2019 was 3 weeks onset of low back pain worse with movement.  He was treated with Percocet at the time.    Patient was brought to the [facility], ED again by his wife last night due to shortness of breath, intermittent chest pain, back pain, abdominal pain and urinary incontinence.  According to his wife, symptom has been going on for for longer than a month.  The last episode of chest pain was roughly 4 days ago and that this was in the setting of significant back pain.  She was not aware of any chest pain by itself recently.  Patient is only able to supply her with any history at this time as he just received a dose of Dilaudid prior to the interview and was extremely drowsy.  His wife denies any awareness of recent blood in the stool or blood in the urine.    On arrival to the ED, he was noted to be severely anemic with hemoglobin of 5.9.  Previous hemoglobin obtained on 07/21/2019 was 11.9.  Alkaline phosphatase was also elevated to 1170.  AST was borderline elevated, ALT normal.  Creatinine 0.95.  Potassium 3.3.  Lactic acid elevated 2.0.  Initial high-sensitivity troponin was 31, subsequently trended up to 261 on repeat lab work. EKG obtained during  this admission showed diffuse ST depression.  HIV test and Covid test were negative.  Stool Hemoccult was negative.  Echocardiogram obtained on 07/25/2019 showed EF 50 to 55%, apical hypokinesis, mild LVH, normal RV, no significant valve issue.  CT of chest abdomen showed severe atherosclerosis, negative PE, numerous sclerotic foci in the spine worrisome for metastatic disease, gastric primary is considered given prominent antral mucosa and adenopathy in the gastrohepatic ligament, large volume ascites.  MRI of lumbar spine showed moderate ascites, diffusely low signal in the liver and the spleen which may reflect hemochromatosis, mild compression fracture throughout the lumbar spine, T12 and L2 edema suggesting recent fractures, diffusely abnormal bone marrow with no focal lesion identified.  MRI of the brain was positive for bone metastasis to the skull base and visible cervical spine.  Cardiology service was consulted for chest pain in the elevated troponin.   Past Medical History:  Diagnosis Date  . Hepatitis C infection    treated with harvoni  . Hypertension   . PAD (peripheral artery disease) ([place])     Past Surgical History:  Procedure Laterality Date  . LIPOMA EXCISION  06/2016   in left groin--- ED since then     Home Medications:  Prior to Admission medications   Medication Sig Start Date End Date Taking? Authorizing Provider  amLODipine (NORVASC) 10 MG tablet Take 10 mg by mouth  daily.   Yes [provider]  cilostazol (PLETAL) 50 MG tablet Take 50 mg by mouth 2 (two) times daily.   Yes [provider]  ferrous sulfate 325 (65 FE) MG tablet Take 325 mg by mouth daily with breakfast.   Yes [provider]  Multiple Vitamins-Minerals (CENTRUM SILVER 50+MEN PO) Take 1 tablet by mouth daily.   Yes [provider]    Inpatient Medications: Scheduled Meds: . bisacodyl  10 mg Oral Once  . dexamethasone (DECADRON) injection  40 mg Intravenous STAT    . pantoprazole (PROTONIX) IV  40 mg Intravenous Q12H  . polyethylene glycol  17 g Oral BID  . sodium chloride flush  3 mL Intravenous Q12H   Continuous Infusions: . sodium chloride     PRN Meds: albuterol, bisacodyl, ondansetron **OR** ondansetron (ZOFRAN) IV  Allergies:   No Known Allergies  Social History:   Social History   Socioeconomic History  . Marital status: Married    Spouse name: Not on file  . Number of children: 3  . Years of education: Not on file  . Highest education level: Not on file  Occupational History  . 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 Smoking status: Current Every Day Smoker    Start date: 23  . Smokeless tobacco: Never Used  Substance and Sexual Activity  . Alcohol use: Yes    Comment: occ beer  . Drug use: Not on file  . Sexual activity: Not on file  Other Topics Concern  . Not on file  Social History Narrative  . Not on file   Social Determinants of Health   Financial Resource Strain:   . Difficulty of Paying Living Expenses:   Food Insecurity:   . Worried About Charity fundraiser in the Last Year:   . Arboriculturist in the Last Year:   Transportation Needs:   . Film/video editor (Medical):   Marland Kitchen Lack of Transportation (Non-Medical):   Physical Activity:   . Days of Exercise per Week:   . Minutes of Exercise per Session:   Stress:   . Feeling of Stress :   Social Connections:   . Frequency of Communication with Friends and Family:   . Frequency of Social Gatherings with Friends and Family:   . Attends Religious Services:   . Active Member of Clubs or Organizations:   . Attends Archivist Meetings:   Marland Kitchen Marital Status:   Intimate Partner Violence:   . Fear of Current or Ex-Partner:   . Emotionally Abused:   Marland Kitchen Physically Abused:   . Sexually Abused:     Family History:    Family History  Problem Relation Age of Onset  . HIV/AIDS Brother      ROS:   Please see the history of present illness.   All other ROS reviewed and negative.     Physical Exam/Data:   Vitals:   08/02/19 1100 08/02/19 1108 08/02/19 1115 08/02/19 1130  BP: 140/74 140/74 140/72 131/76  Pulse: (!) 102 (!) 105 (!) 104 (!) 104  Resp: (!) 32 (!) 30 (!) 29 (!) 23  Temp:  (!) 97.3 F (36.3 C)    TempSrc:  Oral    SpO2: 96%  96% 97%  Weight:      Height:        Intake/Output Summary (Last 24 hours) at 08/02/2019 1425 Last data filed at 08/02/2019 1109 Gross per  24 hour  Intake 1630 ml  Output --  Net 1630 ml   Last 3 Weights 07/07/2019 07/16/2019 06/15/2019  Weight (lbs) 126 lb 15.8 oz 127 lb 121 lb  Weight (kg) 57.6 kg 57.607 kg 54.885 kg     Body mass index is 20.5 kg/m.  General:  Well nourished, well developed, in no acute distress HEENT: normal Lymph: no adenopathy Neck: no JVD Endocrine:  No thryomegaly Vascular: No carotid bruits; FA pulses 2+ bilaterally without bruits  Cardiac:  normal S1, S2; RRR; no murmur  Lungs:  clear to auscultation bilaterally, no wheezing, rhonchi or rales  Abd: soft, nontender, no hepatomegaly  Ext: no edema Musculoskeletal:  No deformities, BUE and BLE strength normal and equal Skin: warm and dry  Neuro:  Unable to assess as patient is sleepy after the dilaudid Psych:  Unable to assess   EKG:  The EKG was personally reviewed and demonstrates:  Sinus rhythm with diffuse ST depression Telemetry:  Telemetry was personally reviewed and demonstrates:  NSR without significant ventricular ectopy  Relevant CV Studies:  Echo 08/02/2019 1. Left ventricular There is mild left ventricular hypertrophy. Apical  window is forshortened. The LVEF is approximately 50 to 55% with apical  hypokinesis.  2. Right ventricular systolic function is normal. The right ventricular  size is normal.  3. The mitral valve is normal in structure. Trivial mitral valve  regurgitation.  4. The aortic valve is tricuspid. Aortic valve  regurgitation is not  visualized. Mild aortic valve sclerosis is present, with no evidence of  aortic valve stenosis.  5. The inferior vena cava is normal in size with greater than 50%  respiratory variability, suggesting right atrial pressure of 3 mmHg.   Laboratory Data:  High Sensitivity Troponin:   Recent Labs  Lab 08/02/19 0421 08/02/19 0801  TROPONINIHS 31* 261*     Chemistry Recent Labs  Lab 08/02/19 0242  NA 139  K 3.3*  CL 100  CO2 23  GLUCOSE 150*  BUN 36*  CREATININE 0.95  CALCIUM 8.7*  GFRNONAA >60  GFRAA >60  ANIONGAP 16*    Recent Labs  Lab 08/02/19 0242  PROT 6.8  ALBUMIN 3.1*  AST 65*  ALT 30  ALKPHOS 1,170*  BILITOT 0.7   Hematology Recent Labs  Lab 08/02/19 0242  WBC 13.3*  RBC 2.06*  HGB 5.9*  HCT 18.3*  MCV 88.8  MCH 28.6  MCHC 32.2  RDW 14.3  PLT 74*   BNPNo results for input(s): BNP, PROBNP in the last 168 hours.  DDimer No results for input(s): DDIMER in the last 168 hours.   Radiology/Studies:  CT Angio Chest PE W and/or Wo Contrast  Result Date: 08/02/2019 CLINICAL DATA:  Chest pain and shortness of breath.  Constipation EXAM: CT ANGIOGRAPHY CHEST CT ABDOMEN AND PELVIS WITH CONTRAST TECHNIQUE: Multidetector CT imaging of the chest was performed using the standard protocol during bolus administration of intravenous contrast. Multiplanar CT image reconstructions and MIPs were obtained to evaluate the vascular anatomy. Multidetector CT imaging of the abdomen and pelvis was performed using the standard protocol during bolus administration of intravenous contrast. CONTRAST:  151mL OMNIPAQUE IOHEXOL 350 MG/ML SOLN COMPARISON:  None. FINDINGS: CTA CHEST FINDINGS Cardiovascular: Normal heart size. No pericardial effusion. There may be hypertrophy of the left ventricle. Heavily calcified aorta, great vessels, and coronary arteries. 40% narrowing of the brachiocephalic artery on coronal reformats. Assessment of the left common carotid  atherosclerotic narrowing is limited by streak artifact. That no  pulmonary artery filling defect. Mediastinum/Nodes: Negative for adenopathy or mass Lungs/Pleura: Airway plugging in the left lower lobe. Bilateral lower lobe atelectasis and small volume left pleural effusion. Musculoskeletal: Patchy sclerosis in the sternum and throughout the spine primarily worrisome for metastatic disease. Remote bilateral rib fractures. T7 superior endplate deformity without visible acute fracture line. Review of the MIP images confirms the above findings. CT ABDOMEN and PELVIS FINDINGS Hepatobiliary: Noted history of hepatitis-C. No overt cirrhotic changes. Tiny cystic density in the central liver.No evidence of biliary obstruction or stone. Pancreas: Unremarkable. Spleen: Unremarkable. Adrenals/Urinary Tract: Negative adrenals. No hydronephrosis or stone. Right upper pole cortical scarring. Distended urinary bladder without focal abnormality. Stomach/Bowel: No obstruction or visible inflammation. Prominent enhancement and redundant appearance to the gastric antral mucosa. Moderate stool retention. Vascular/Lymphatic: No acute vascular abnormality. Heavily calcified aorta and iliacs with advanced bilateral iliac and common femoral/superficial femoral artery narrowings. No mass or adenopathy. Reproductive:Probable TURP defect. Other: Large volume ascites which does not appear equally distributed throughout the abdomen. There may be some peritoneal thickening and smooth linear enhancement, no discrete peritoneal nodule. Adenopathy in the gastrohepatic ligament measuring up to 13 mm short axis. Musculoskeletal: Patchy sclerosis in the lumbar spine. There are T12, L1, L2, and L3 superior endplate deformities, age-indeterminate. Review of the MIP images confirms the above findings. IMPRESSION: Abdominal CT: 1. Numerous sclerotic foci in the spine worrisome for metastatic disease. Prominent intrathecal enhancement, suggest MR follow-up  if there is myelopathy or polyneuropathy. There are multiple superior endplate fractures, age-indeterminate. 2. A gastric primary is considered given the prominent antral mucosa and adenopathy in the gastrohepatic ligament. 3. Large volume ascites. No discrete peritoneal nodule, but there is some loculation and probable peritoneal thickening, suggest paracentesis with cytology. 4. Severe atherosclerosis. Chest CTA: 1. Negative for pulmonary embolism. 2. Bilateral lower lobe atelectasis with bronchial plugging and trace effusion on the left. 3. Severe atherosclerosis. Electronically Signed   By: Monte Fantasia M.D.   On: 08/02/2019 04:43   MR BRAIN W WO CONTRAST  Result Date: 08/02/2019 CLINICAL DATA:  69 year old male with evidence of spinal metastatic disease on CT Chest, Abdomen, and Pelvis today. EXAM: MRI HEAD WITHOUT AND WITH CONTRAST TECHNIQUE: Multiplanar, multiecho pulse sequences of the brain and surrounding structures were obtained without and with intravenous contrast. CONTRAST:  24mL GADAVIST GADOBUTROL 1 MMOL/ML IV SOLN COMPARISON:  No prior brain imaging. FINDINGS: Brain: There is subtle abnormal diffusion in white matter near the left splenium of the corpus callosum (series 7 image 42). Mild T2 and FLAIR hyperintensity is associated, with scattered additional patchy white matter T2 and FLAIR hyperintensity elsewhere. Similar T2 and FLAIR hyperintensity in the left basal ganglia compatible with chronic lacunar infarct. No other abnormal diffusion. No cortical encephalomalacia identified. There is a chronic microhemorrhage in the left thalamus. Questionable similar chronic microhemorrhage in the right deep cerebellar nuclei. Motion degraded postcontrast imaging throughout the brain. No definite abnormal parenchymal enhancement. No midline shift, mass effect, ventriculomegaly, extra-axial collection or acute intracranial hemorrhage. Cervicomedullary junction and pituitary are within normal limits.  Vascular: Major intracranial vascular flow voids are preserved. Postcontrast images are motion degraded but the superior sagittal sinus, transverse and sigmoid sinuses appear to be grossly patent and enhancing. Skull and upper cervical spine: Abnormal marrow signal in the occipital condyles and throughout the visible cervical spine. Superimposed discrete and expansile bone mass at the left lower temporal bone (series 9, image 18, series 5, image 52. Associated mild left mastoid effusion. No other destructive skull  lesion identified. Sinuses/Orbits: Negative. Other: Right mastoids remain clear.  No scalp soft tissue lesion. IMPRESSION: 1. Positive for bone metastases to the skull base and visible cervical spine, including a mildly expansile lesion of the left temporal bone. Mild associated left mastoid effusion. 2. Substantially degraded postcontrast images of the brain with no obvious brain metastasis. A repeat staging Brain MRI when the patient can better cooperate would be valuable. 3. Evidence of small vessel disease with possible recent white matter ischemia near the left splenium. Electronically Signed   By: Genevie Ann M.D.   On: 08/02/2019 14:22   CT ABDOMEN PELVIS W CONTRAST  Result Date: 08/02/2019 CLINICAL DATA:  Chest pain and shortness of breath.  Constipation EXAM: CT ANGIOGRAPHY CHEST CT ABDOMEN AND PELVIS WITH CONTRAST TECHNIQUE: Multidetector CT imaging of the chest was performed using the standard protocol during bolus administration of intravenous contrast. Multiplanar CT image reconstructions and MIPs were obtained to evaluate the vascular anatomy. Multidetector CT imaging of the abdomen and pelvis was performed using the standard protocol during bolus administration of intravenous contrast. CONTRAST:  136mL OMNIPAQUE IOHEXOL 350 MG/ML SOLN COMPARISON:  None. FINDINGS: CTA CHEST FINDINGS Cardiovascular: Normal heart size. No pericardial effusion. There may be hypertrophy of the left ventricle.  Heavily calcified aorta, great vessels, and coronary arteries. 40% narrowing of the brachiocephalic artery on coronal reformats. Assessment of the left common carotid atherosclerotic narrowing is limited by streak artifact. That no pulmonary artery filling defect. Mediastinum/Nodes: Negative for adenopathy or mass Lungs/Pleura: Airway plugging in the left lower lobe. Bilateral lower lobe atelectasis and small volume left pleural effusion. Musculoskeletal: Patchy sclerosis in the sternum and throughout the spine primarily worrisome for metastatic disease. Remote bilateral rib fractures. T7 superior endplate deformity without visible acute fracture line. Review of the MIP images confirms the above findings. CT ABDOMEN and PELVIS FINDINGS Hepatobiliary: Noted history of hepatitis-C. No overt cirrhotic changes. Tiny cystic density in the central liver.No evidence of biliary obstruction or stone. Pancreas: Unremarkable. Spleen: Unremarkable. Adrenals/Urinary Tract: Negative adrenals. No hydronephrosis or stone. Right upper pole cortical scarring. Distended urinary bladder without focal abnormality. Stomach/Bowel: No obstruction or visible inflammation. Prominent enhancement and redundant appearance to the gastric antral mucosa. Moderate stool retention. Vascular/Lymphatic: No acute vascular abnormality. Heavily calcified aorta and iliacs with advanced bilateral iliac and common femoral/superficial femoral artery narrowings. No mass or adenopathy. Reproductive:Probable TURP defect. Other: Large volume ascites which does not appear equally distributed throughout the abdomen. There may be some peritoneal thickening and smooth linear enhancement, no discrete peritoneal nodule. Adenopathy in the gastrohepatic ligament measuring up to 13 mm short axis. Musculoskeletal: Patchy sclerosis in the lumbar spine. There are T12, L1, L2, and L3 superior endplate deformities, age-indeterminate. Review of the MIP images confirms the  above findings. IMPRESSION: Abdominal CT: 1. Numerous sclerotic foci in the spine worrisome for metastatic disease. Prominent intrathecal enhancement, suggest MR follow-up if there is myelopathy or polyneuropathy. There are multiple superior endplate fractures, age-indeterminate. 2. A gastric primary is considered given the prominent antral mucosa and adenopathy in the gastrohepatic ligament. 3. Large volume ascites. No discrete peritoneal nodule, but there is some loculation and probable peritoneal thickening, suggest paracentesis with cytology. 4. Severe atherosclerosis. Chest CTA: 1. Negative for pulmonary embolism. 2. Bilateral lower lobe atelectasis with bronchial plugging and trace effusion on the left. 3. Severe atherosclerosis. Electronically Signed   By: Monte Fantasia M.D.   On: 08/02/2019 04:43     Assessment and Plan:   1. Chest pain  with abnormal EKG and a positive troponin  -Previous stress echo obtained in January 2020 was normal.  Repeat echocardiogram obtained on 6/28 showed low borderline EF 50 to 55%, with apical hypokinesis, otherwise no significant valve disease.  -EKG showed diffuse ST depression likely related to severe anemia and low coronary perfusion.  -Suspect that with blood transfusion, ST depression will improve as well.  -History of chest pain supplied by family member who was at bedside.  Chest pain only occurs at the same time as back pain.  Symptom of chest pain atypical for cardiac disease  - given metastatic disease, patient is unlikely to be a candidate for ischemic workup  2. Severe symptomatic anemia:   -Patient underwent blood transfusion.  3. Metastatic cancer likely GI in etiology: For the past several weeks, patient has been having significant back pain with any body movement  -CT of chest and abdomen concerning for metastatic disease to the spine, likely GI etiology.  MRI of the head consistent with metastatic disease to the skull and cervical  spine  -Medical oncology on board  4. Elevated alkaline phosphatase: Likely related to bone metastasis.  5. Ascites: large ascites seen on CT, however MRI of abdomen suggest this is more moderate in size      For questions or updates, please contact [place] Please consult [email or web address] for contact info under    Signed, Almyra Deforest, [place]  08/02/2019 2:25 PM

## 2019-08-02 NOTE — ED Notes (Signed)
[facility] called to transport pt to [facility]

## 2019-08-03 ENCOUNTER — Inpatient Hospital Stay (HOSPITAL_COMMUNITY): Payer: Medicare HMO | Admitting: Certified Registered Nurse Anesthetist

## 2019-08-03 ENCOUNTER — Other Ambulatory Visit: Payer: Self-pay

## 2019-08-03 ENCOUNTER — Inpatient Hospital Stay (HOSPITAL_COMMUNITY): Payer: Medicare HMO

## 2019-08-03 DIAGNOSIS — I214 Non-ST elevation (NSTEMI) myocardial infarction: Secondary | ICD-10-CM

## 2019-08-03 DIAGNOSIS — I10 Essential (primary) hypertension: Secondary | ICD-10-CM

## 2019-08-03 DIAGNOSIS — R778 Other specified abnormalities of plasma proteins: Secondary | ICD-10-CM

## 2019-08-03 DIAGNOSIS — R1013 Epigastric pain: Secondary | ICD-10-CM

## 2019-08-03 DIAGNOSIS — C7951 Secondary malignant neoplasm of bone: Secondary | ICD-10-CM

## 2019-08-03 DIAGNOSIS — R188 Other ascites: Secondary | ICD-10-CM

## 2019-08-03 DIAGNOSIS — I2510 Atherosclerotic heart disease of native coronary artery without angina pectoris: Secondary | ICD-10-CM

## 2019-08-03 DIAGNOSIS — I700 Atherosclerosis of aorta: Secondary | ICD-10-CM

## 2019-08-03 DIAGNOSIS — R180 Malignant ascites: Secondary | ICD-10-CM

## 2019-08-03 LAB — BODY FLUID CELL COUNT WITH DIFFERENTIAL
Eos, Fluid: 1 %
Lymphs, Fluid: 60 %
Monocyte-Macrophage-Serous Fluid: 37 % — ABNORMAL LOW (ref 50–90)
Neutrophil Count, Fluid: 2 % (ref 0–25)
Total Nucleated Cell Count, Fluid: 1729 cu mm — ABNORMAL HIGH (ref 0–1000)

## 2019-08-03 LAB — TYPE AND SCREEN
ABO/RH(D): AB POS
ABO/RH(D): AB POS
Antibody Screen: NEGATIVE
Antibody Screen: NEGATIVE
Unit division: 0
Unit division: 0
Unit division: 0
Unit division: 0
Unit division: 0

## 2019-08-03 LAB — CBC
HCT: 20.2 % — ABNORMAL LOW (ref 39.0–52.0)
Hemoglobin: 6.9 g/dL — CL (ref 13.0–17.0)
MCH: 29.1 pg (ref 26.0–34.0)
MCHC: 34.2 g/dL (ref 30.0–36.0)
MCV: 85.2 fL (ref 80.0–100.0)
Platelets: 44 10*3/uL — ABNORMAL LOW (ref 150–400)
RBC: 2.37 MIL/uL — ABNORMAL LOW (ref 4.22–5.81)
RDW: 15.7 % — ABNORMAL HIGH (ref 11.5–15.5)
WBC: 10.5 10*3/uL (ref 4.0–10.5)
nRBC: 30.9 % — ABNORMAL HIGH (ref 0.0–0.2)

## 2019-08-03 LAB — COMPREHENSIVE METABOLIC PANEL
ALT: 28 U/L (ref 0–44)
AST: 63 U/L — ABNORMAL HIGH (ref 15–41)
Albumin: 2.9 g/dL — ABNORMAL LOW (ref 3.5–5.0)
Alkaline Phosphatase: 918 U/L — ABNORMAL HIGH (ref 38–126)
Anion gap: 11 (ref 5–15)
BUN: 56 mg/dL — ABNORMAL HIGH (ref 8–23)
CO2: 23 mmol/L (ref 22–32)
Calcium: 8.2 mg/dL — ABNORMAL LOW (ref 8.9–10.3)
Chloride: 111 mmol/L (ref 98–111)
Creatinine, Ser: 0.82 mg/dL (ref 0.61–1.24)
GFR calc Af Amer: >60 mL/min (ref >60)
GFR calc non Af Amer: >60 mL/min (ref >60)
Glucose, Bld: 134 mg/dL — ABNORMAL HIGH (ref 70–99)
Potassium: 4.1 mmol/L (ref 3.5–5.1)
Sodium: 145 mmol/L (ref 135–145)
Total Bilirubin: 0.8 mg/dL (ref 0.3–1.2)
Total Protein: 6.4 g/dL — ABNORMAL LOW (ref 6.5–8.1)

## 2019-08-03 LAB — BPAM RBC
Blood Product Expiration Date: 202107072359
Blood Product Expiration Date: 202107072359
Blood Product Expiration Date: 202108012359
Blood Product Expiration Date: 202107282359
Blood Product Expiration Date: 202107282359
ISSUE DATE / TIME: 202106171500
ISSUE DATE / TIME: 202106280810
Unit Type and Rh: 7300
Unit Type and Rh: 7300
Unit Type and Rh: 8400
Unit Type and Rh: 6200
Unit Type and Rh: 6200

## 2019-08-03 LAB — URINALYSIS, ROUTINE W REFLEX MICROSCOPIC
Bacteria, UA: NONE SEEN
Bilirubin Urine: NEGATIVE
Glucose, UA: NEGATIVE mg/dL
Ketones, ur: NEGATIVE mg/dL
Leukocytes,Ua: NEGATIVE
Nitrite: NEGATIVE
Protein, ur: NEGATIVE mg/dL
Specific Gravity, Urine: 1.019 (ref 1.005–1.030)
pH: 5 (ref 5.0–8.0)

## 2019-08-03 LAB — PREPARE FRESH FROZEN PLASMA: Unit division: 0

## 2019-08-03 LAB — BPAM FFP
Blood Product Expiration Date: 202107032359
Blood Product Expiration Date: 202107032359
Unit Type and Rh: 2800
Unit Type and Rh: 8400

## 2019-08-03 LAB — PREPARE RBC (CROSSMATCH)

## 2019-08-03 LAB — IRON AND TIBC
Iron: 119 ug/dL (ref 45–182)
Saturation Ratios: 32 % (ref 17.9–39.5)
TIBC: 376 ug/dL (ref 250–450)
UIBC: 257 ug/dL

## 2019-08-03 LAB — VITAMIN B12: Vitamin B-12: 1220 pg/mL — ABNORMAL HIGH (ref 180–914)

## 2019-08-03 LAB — TROPONIN I (HIGH SENSITIVITY): Troponin I (High Sensitivity): 833 ng/L (ref <18)

## 2019-08-03 LAB — HEMOGLOBIN AND HEMATOCRIT, BLOOD
HCT: 21.5 % — ABNORMAL LOW (ref 39.0–52.0)
Hemoglobin: 7 g/dL — ABNORMAL LOW (ref 13.0–17.0)

## 2019-08-03 LAB — FOLATE: Folate: 9.8 ng/mL (ref >5.9)

## 2019-08-03 LAB — PSA: Prostatic Specific Antigen: 3.16 ng/mL (ref 0.00–4.00)

## 2019-08-03 LAB — FERRITIN: Ferritin: 22 ng/mL — ABNORMAL LOW (ref 24–336)

## 2019-08-03 LAB — ABO/RH: ABO/RH(D): AB POS

## 2019-08-03 MED ORDER — SODIUM CHLORIDE 0.9% IV SOLUTION: Freq: Once | INTRAVENOUS | Status: DC

## 2019-08-03 MED ORDER — AMLODIPINE BESYLATE 10 MG PO TABS
10.0000 mg | ORAL_TABLET | Freq: Every day | ORAL | Status: DC
  Administered 2019-08-03 – 2019-08-04 (×2): 10 mg via ORAL
  Filled 2019-08-03 (×2): qty 1

## 2019-08-03 MED ORDER — CARVEDILOL 3.125 MG PO TABS: 3.1250 mg | ORAL_TABLET | Freq: Two times a day (BID) | ORAL | Status: DC

## 2019-08-03 MED ORDER — ENSURE ENLIVE PO LIQD
237.0000 mL | Freq: Three times a day (TID) | ORAL | Status: DC
  Administered 2019-08-03: 237 mL via ORAL

## 2019-08-03 MED ORDER — DEXAMETHASONE SODIUM PHOSPHATE 10 MG/ML IJ SOLN
10.0000 mg | Freq: Two times a day (BID) | INTRAMUSCULAR | Status: DC
  Administered 2019-08-03 – 2019-08-04 (×3): 10 mg via INTRAVENOUS
  Filled 2019-08-03 (×3): qty 1

## 2019-08-03 MED ORDER — ATORVASTATIN CALCIUM 40 MG PO TABS: 80.0000 mg | ORAL_TABLET | Freq: Every day | ORAL | Status: DC

## 2019-08-03 MED ORDER — SODIUM CHLORIDE 0.9% IV SOLUTION: Freq: Once | INTRAVENOUS | Status: AC

## 2019-08-03 MED ORDER — LIDOCAINE HCL 1 % IJ SOLN
INTRAMUSCULAR | Status: AC
  Filled 2019-08-03: qty 20

## 2019-08-03 NOTE — Progress Notes (Signed)
Progress Note  Patient Name: Dylan Kaufman Date of Encounter: 08/03/2019  Primary Cardiologist: New to [facility]; Dr. Debara Pickett  Subjective   Multiple grievances this morning - feeling uninformed, frustrated with NPO status, frustrated with recent findings c/f metastatic cancer. He states he wants to live for as long as possible. He has some chest discomfort when attempting to cough or hiccupping. He has increase phlegm. No SOB or palpitations.   Inpatient Medications    Scheduled Meds: . sodium chloride   Intravenous Once  . sodium chloride   Intravenous Once  . bisacodyl  10 mg Oral Once  . dexamethasone (DECADRON) injection  10 mg Intravenous Q12H  . polyethylene glycol  17 g Oral BID  . sodium chloride flush  3 mL Intravenous Q12H   Continuous Infusions: . pantoprazole (PROTONIX) IV 80 mg (08/02/19 2311)   PRN Meds: albuterol, bisacodyl, HYDROmorphone (DILAUDID) injection, ondansetron **OR** ondansetron (ZOFRAN) IV   Vital Signs    Vitals:   08/03/19 0352 08/03/19 0700 08/03/19 0703 08/03/19 0730  BP: (!) 160/85 (!) 166/89  (!) 164/87  Pulse: 99 98  98  Resp: 19 19  19   Temp: 98.4 F (36.9 C) (!) 97.4 F (36.3 C)  98.3 F (36.8 C)  TempSrc: Oral Oral  Oral  SpO2: 100% 97%  96%  Weight:   128.7 kg   Height:        Intake/Output Summary (Last 24 hours) at 08/03/2019 0914 Last data filed at 08/03/2019 6808 Gross per 24 hour  Intake 2131.15 ml  Output 400 ml  Net 1731.15 ml   Filed Weights   07/31/2019 2124 08/03/19 0703  Weight: 57.6 kg 128.7 kg    Telemetry    Sinus tachycardia with occasional PVCs.  - Personally Reviewed  ECG    Sinus rhythm, rate 100 bpm, continued, if not worsening STD in anterolateral leads, no STE.  - Personally Reviewed  Physical Exam   GEN: Thin gentleman laying in bed in no acute distress.   Neck: No JVD, no carotid bruits Cardiac: RRR, no murmurs, rubs, or gallops.  Respiratory: Clear to auscultation bilaterally, no  wheezes/ rales/ rhonchi GI: NABS, Soft, distended  MS: No edema; No deformity. Neuro:  Has some sensation in distal LE though unable to move legs Psych: anxious   Labs    Chemistry Recent Labs  Lab 08/02/19 0242  NA 139  K 3.3*  CL 100  CO2 23  GLUCOSE 150*  BUN 36*  CREATININE 0.95  CALCIUM 8.7*  PROT 6.8  ALBUMIN 3.1*  AST 65*  ALT 30  ALKPHOS 1,170*  BILITOT 0.7  GFRNONAA >60  GFRAA >60  ANIONGAP 16*     Hematology Recent Labs  Lab 08/02/19 0242 08/02/19 1500  WBC 13.3*  --   RBC 2.06*  --   HGB 5.9* 8.5*  HCT 18.3* 26.2*  MCV 88.8  --   MCH 28.6  --   MCHC 32.2  --   RDW 14.3  --   PLT 74*  --     Cardiac EnzymesNo results for input(s): TROPONINI in the last 168 hours. No results for input(s): TROPIPOC in the last 168 hours.   BNPNo results for input(s): BNP, PROBNP in the last 168 hours.   DDimer No results for input(s): DDIMER in the last 168 hours.   Radiology    CT Angio Chest PE W and/or Wo Contrast  Result Date: 08/02/2019 CLINICAL DATA:  Chest pain and shortness of breath.  Constipation EXAM: CT ANGIOGRAPHY CHEST CT ABDOMEN AND PELVIS WITH CONTRAST TECHNIQUE: Multidetector CT imaging of the chest was performed using the standard protocol during bolus administration of intravenous contrast. Multiplanar CT image reconstructions and MIPs were obtained to evaluate the vascular anatomy. Multidetector CT imaging of the abdomen and pelvis was performed using the standard protocol during bolus administration of intravenous contrast. CONTRAST:  141m OMNIPAQUE IOHEXOL 350 MG/ML SOLN COMPARISON:  None. FINDINGS: CTA CHEST FINDINGS Cardiovascular: Normal heart size. No pericardial effusion. There may be hypertrophy of the left ventricle. Heavily calcified aorta, great vessels, and coronary arteries. 40% narrowing of the brachiocephalic artery on coronal reformats. Assessment of the left common carotid atherosclerotic narrowing is limited by streak artifact.  That no pulmonary artery filling defect. Mediastinum/Nodes: Negative for adenopathy or mass Lungs/Pleura: Airway plugging in the left lower lobe. Bilateral lower lobe atelectasis and small volume left pleural effusion. Musculoskeletal: Patchy sclerosis in the sternum and throughout the spine primarily worrisome for metastatic disease. Remote bilateral rib fractures. T7 superior endplate deformity without visible acute fracture line. Review of the MIP images confirms the above findings. CT ABDOMEN and PELVIS FINDINGS Hepatobiliary: Noted history of hepatitis-C. No overt cirrhotic changes. Tiny cystic density in the central liver.No evidence of biliary obstruction or stone. Pancreas: Unremarkable. Spleen: Unremarkable. Adrenals/Urinary Tract: Negative adrenals. No hydronephrosis or stone. Right upper pole cortical scarring. Distended urinary bladder without focal abnormality. Stomach/Bowel: No obstruction or visible inflammation. Prominent enhancement and redundant appearance to the gastric antral mucosa. Moderate stool retention. Vascular/Lymphatic: No acute vascular abnormality. Heavily calcified aorta and iliacs with advanced bilateral iliac and common femoral/superficial femoral artery narrowings. No mass or adenopathy. Reproductive:Probable TURP defect. Other: Large volume ascites which does not appear equally distributed throughout the abdomen. There may be some peritoneal thickening and smooth linear enhancement, no discrete peritoneal nodule. Adenopathy in the gastrohepatic ligament measuring up to 13 mm short axis. Musculoskeletal: Patchy sclerosis in the lumbar spine. There are T12, L1, L2, and L3 superior endplate deformities, age-indeterminate. Review of the MIP images confirms the above findings. IMPRESSION: Abdominal CT: 1. Numerous sclerotic foci in the spine worrisome for metastatic disease. Prominent intrathecal enhancement, suggest MR follow-up if there is myelopathy or polyneuropathy. There are  multiple superior endplate fractures, age-indeterminate. 2. A gastric primary is considered given the prominent antral mucosa and adenopathy in the gastrohepatic ligament. 3. Large volume ascites. No discrete peritoneal nodule, but there is some loculation and probable peritoneal thickening, suggest paracentesis with cytology. 4. Severe atherosclerosis. Chest CTA: 1. Negative for pulmonary embolism. 2. Bilateral lower lobe atelectasis with bronchial plugging and trace effusion on the left. 3. Severe atherosclerosis. Electronically Signed   By: JMonte FantasiaM.D.   On: 08/02/2019 04:43   MR BRAIN W WO CONTRAST  Result Date: 08/02/2019 CLINICAL DATA:  69year old male with evidence of spinal metastatic disease on CT Chest, Abdomen, and Pelvis today. EXAM: MRI HEAD WITHOUT AND WITH CONTRAST TECHNIQUE: Multiplanar, multiecho pulse sequences of the brain and surrounding structures were obtained without and with intravenous contrast. CONTRAST:  680mGADAVIST GADOBUTROL 1 MMOL/ML IV SOLN COMPARISON:  No prior brain imaging. FINDINGS: Brain: There is subtle abnormal diffusion in white matter near the left splenium of the corpus callosum (series 7 image 42). Mild T2 and FLAIR hyperintensity is associated, with scattered additional patchy white matter T2 and FLAIR hyperintensity elsewhere. Similar T2 and FLAIR hyperintensity in the left basal ganglia compatible with chronic lacunar infarct. No other abnormal diffusion. No cortical encephalomalacia identified. There is  a chronic microhemorrhage in the left thalamus. Questionable similar chronic microhemorrhage in the right deep cerebellar nuclei. Motion degraded postcontrast imaging throughout the brain. No definite abnormal parenchymal enhancement. No midline shift, mass effect, ventriculomegaly, extra-axial collection or acute intracranial hemorrhage. Cervicomedullary junction and pituitary are within normal limits. Vascular: Major intracranial vascular flow voids are  preserved. Postcontrast images are motion degraded but the superior sagittal sinus, transverse and sigmoid sinuses appear to be grossly patent and enhancing. Skull and upper cervical spine: Abnormal marrow signal in the occipital condyles and throughout the visible cervical spine. Superimposed discrete and expansile bone mass at the left lower temporal bone (series 9, image 18, series 5, image 52. Associated mild left mastoid effusion. No other destructive skull lesion identified. Sinuses/Orbits: Negative. Other: Right mastoids remain clear.  No scalp soft tissue lesion. IMPRESSION: 1. Positive for bone metastases to the skull base and visible cervical spine, including a mildly expansile lesion of the left temporal bone. Mild associated left mastoid effusion. 2. Substantially degraded postcontrast images of the brain with no obvious brain metastasis. A repeat staging Brain MRI when the patient can better cooperate would be valuable. 3. Evidence of small vessel disease with possible recent white matter ischemia near the left splenium. Electronically Signed   By: Genevie Ann M.D.   On: 08/02/2019 14:22   MR THORACIC SPINE W WO CONTRAST  Result Date: 08/02/2019 CLINICAL DATA:  Back pain. Abnormal spine on CT. Chronic hepatitis C infection. EXAM: MRI THORACIC WITHOUT AND WITH CONTRAST TECHNIQUE: Multiplanar and multiecho pulse sequences of the thoracic spine were obtained without and with intravenous contrast. CONTRAST:  86m GADAVIST GADOBUTROL 1 MMOL/ML IV SOLN COMPARISON:  CT chest abdomen pelvis earlier today. FINDINGS: MRI THORACIC SPINE FINDINGS Alignment:  Normal Vertebrae: Diffusely abnormal bone marrow throughout the thoracic spine with diffuse low signal intensity suggesting a diffuse infiltrative process. Scattered sclerotic lesions are present at several levels on CT. Mild compression fracture of T5 with bone marrow edema. Mild compression fractures of T7, T8, T9, T10 with bone marrow edema. Mild compression  fracture T12 with mild bone marrow edema. There is heterogeneous enhancement of the bone marrow involving these fractures. Cord: There is spinal stenosis due to epidural soft tissue thickening from T6 through T10. This is causing cord flattening and mild cord compression. No cord signal abnormality is identified. Paraspinal and other soft tissues: Epidural soft tissue thickening and enhancement from T6 through T10. There appears to be infiltration of the posterior epidural fat with enhancement. This is predominately posterior but appears circumferential. This is contributing to moderate spinal stenosis Disc levels: Negative for disc protrusion.  Negative for disc space infection. IMPRESSION: Diffusely abnormal bone marrow throughout the thoracic spine suggesting an infiltrative process such as lymphoma, metastatic disease, or multiple myeloma. Multiple pathologic fractures that appear recent involving T5, T7, T9, T10, T12 Extensive epidural thickening and enhancement in the midthoracic spine causing compression of the spinal cord. 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 These results were called by telephone at the time of interpretation on 08/02/2019 at 2:48 pm to provider Matcha, who verbally acknowledged these results. Electronically Signed   By: CFranchot GalloM.D.   On: 08/02/2019 14:51   MR Lumbar Spine W Wo Contrast  Result Date: 08/02/2019 CLINICAL DATA:  Back pain.  Abnormal CT of the spine. EXAM: MRI LUMBAR SPINE WITHOUT AND WITH CONTRAST TECHNIQUE: Multiplanar and multiecho pulse sequences of the lumbar spine were obtained without and with intravenous contrast. CONTRAST:  667mGADAVIST GADOBUTROL 1 MMOL/ML IV  SOLN COMPARISON:  CT  chest abdomen pelvis 08/02/2019 FINDINGS: Segmentation:  Normal Alignment:  Normal Vertebrae: Diffusely abnormal bone marrow which is markedly low signal on T1. Mild superior and inferior endplate fractures are present throughout the lumbar spine. There is mild edema involving the T12 and L2  fracture which may be recent. No focal mass lesion is identified. Conus medullaris and cauda equina: Conus extends to the L1-2 level. Conus and cauda equina appear normal. Paraspinal and other soft tissues: Moderate ascites. Liver and spleen are diffusely low signal. Patient has a history of chronic hepatitis C infection. No paraspinous adenopathy. Disc levels: L1-2: Negative L2-3: Negative L3-4: Small central disc protrusion.  Negative for stenosis L4-5: Small central disc protrusion.  Negative for stenosis L5-S1: Negative IMPRESSION: Diffusely abnormal bone marrow. The patient has severe anemia. No focal lesion identified. Possible lymphoma or myeloproliferative disorder. Also consider widespread metastatic disease or myeloma. Consider bone marrow biopsy. Multiple mild compression fractures throughout the lumbar spine. There is mild edema at T12 and L2 suggesting recent fractures. These are mild. Moderate ascites. Diffusely low signal in the liver and spleen which may reflect hemochromatosis. Electronically Signed   By: Franchot Gallo M.D.   On: 08/02/2019 14:25   CT ABDOMEN PELVIS W CONTRAST  Result Date: 08/02/2019 CLINICAL DATA:  Chest pain and shortness of breath.  Constipation EXAM: CT ANGIOGRAPHY CHEST CT ABDOMEN AND PELVIS WITH CONTRAST TECHNIQUE: Multidetector CT imaging of the chest was performed using the standard protocol during bolus administration of intravenous contrast. Multiplanar CT image reconstructions and MIPs were obtained to evaluate the vascular anatomy. Multidetector CT imaging of the abdomen and pelvis was performed using the standard protocol during bolus administration of intravenous contrast. CONTRAST:  15m OMNIPAQUE IOHEXOL 350 MG/ML SOLN COMPARISON:  None. FINDINGS: CTA CHEST FINDINGS Cardiovascular: Normal heart size. No pericardial effusion. There may be hypertrophy of the left ventricle. Heavily calcified aorta, great vessels, and coronary arteries. 40% narrowing of the  brachiocephalic artery on coronal reformats. Assessment of the left common carotid atherosclerotic narrowing is limited by streak artifact. That no pulmonary artery filling defect. Mediastinum/Nodes: Negative for adenopathy or mass Lungs/Pleura: Airway plugging in the left lower lobe. Bilateral lower lobe atelectasis and small volume left pleural effusion. Musculoskeletal: Patchy sclerosis in the sternum and throughout the spine primarily worrisome for metastatic disease. Remote bilateral rib fractures. T7 superior endplate deformity without visible acute fracture line. Review of the MIP images confirms the above findings. CT ABDOMEN and PELVIS FINDINGS Hepatobiliary: Noted history of hepatitis-C. No overt cirrhotic changes. Tiny cystic density in the central liver.No evidence of biliary obstruction or stone. Pancreas: Unremarkable. Spleen: Unremarkable. Adrenals/Urinary Tract: Negative adrenals. No hydronephrosis or stone. Right upper pole cortical scarring. Distended urinary bladder without focal abnormality. Stomach/Bowel: No obstruction or visible inflammation. Prominent enhancement and redundant appearance to the gastric antral mucosa. Moderate stool retention. Vascular/Lymphatic: No acute vascular abnormality. Heavily calcified aorta and iliacs with advanced bilateral iliac and common femoral/superficial femoral artery narrowings. No mass or adenopathy. Reproductive:Probable TURP defect. Other: Large volume ascites which does not appear equally distributed throughout the abdomen. There may be some peritoneal thickening and smooth linear enhancement, no discrete peritoneal nodule. Adenopathy in the gastrohepatic ligament measuring up to 13 mm short axis. Musculoskeletal: Patchy sclerosis in the lumbar spine. There are T12, L1, L2, and L3 superior endplate deformities, age-indeterminate. Review of the MIP images confirms the above findings. IMPRESSION: Abdominal CT: 1. Numerous sclerotic foci in the spine  worrisome for metastatic disease. Prominent  intrathecal enhancement, suggest MR follow-up if there is myelopathy or polyneuropathy. There are multiple superior endplate fractures, age-indeterminate. 2. A gastric primary is considered given the prominent antral mucosa and adenopathy in the gastrohepatic ligament. 3. Large volume ascites. No discrete peritoneal nodule, but there is some loculation and probable peritoneal thickening, suggest paracentesis with cytology. 4. Severe atherosclerosis. Chest CTA: 1. Negative for pulmonary embolism. 2. Bilateral lower lobe atelectasis with bronchial plugging and trace effusion on the left. 3. Severe atherosclerosis. Electronically Signed   By: Monte Fantasia M.D.   On: 08/02/2019 04:43   ECHOCARDIOGRAM COMPLETE  Result Date: 08/02/2019    ECHOCARDIOGRAM REPORT   Patient Name:   [facility] Date of Exam: 08/02/2019 Medical Rec #:  [id number]    Height:       66.0 in Accession #:    [id number]   Weight:       127.0 lb Date of Birth:  May 25, 1950   BSA:          1.649 m Patient Age:    70 years     BP:           128/72 mmHg Patient Gender: M            HR:           104 bpm. Exam Location:  Inpatient Procedure: 2D Echo, Cardiac Doppler and Color Doppler Indications:    122-I22.9 Subsequent ST elevation (STEM) and non-ST elevation                 (NSTEMI) myocardial infarction  History:        Patient has no prior history of Echocardiogram examinations.                 Signs/Symptoms:Chest Pain and Dyspnea; Risk Factors:Current                 Smoker and Hypertension. HEP C.  Sonographer:    Roseanna Rainbow RDCS Referring Phys: [id number] [facility] A THOMAS  Sonographer Comments: Technically difficult study due to poor echo windows. Patient in pain and in supine position. Patient asked me to stop pressing on him in apical region. IMPRESSIONS  1. Left ventricular There is mild left ventricular hypertrophy. Apical window is forshortened. The LVEF is approximately 50 to 55% with apical  hypokinesis.  2. Right ventricular systolic function is normal. The right ventricular size is normal.  3. The mitral valve is normal in structure. Trivial mitral valve regurgitation.  4. The aortic valve is tricuspid. Aortic valve regurgitation is not visualized. Mild aortic valve sclerosis is present, with no evidence of aortic valve stenosis.  5. The inferior vena cava is normal in size with greater than 50% respiratory variability, suggesting right atrial pressure of 3 mmHg. FINDINGS  Left Ventricle: Left ventricular ejection fraction, by estimation, is 50 to 55%. The left ventricle has low normal function. The left ventricle demonstrates regional wall motion abnormalities. The left ventricular internal cavity size was normal in size. There is mild left ventricular hypertrophy. Left ventricular diastolic parameters are consistent with Grade I diastolic dysfunction (impaired relaxation). Right Ventricle: The right ventricular size is normal. Right vetricular wall thickness was not assessed. Right ventricular systolic function is normal. Left Atrium: Left atrial size was normal in size. Right Atrium: Right atrial size was normal in size. Pericardium: There is no evidence of pericardial effusion. Mitral Valve: The mitral valve is normal in structure. Trivial mitral valve regurgitation. Tricuspid Valve: The tricuspid valve is normal in  structure. Tricuspid valve regurgitation is trivial. Aortic Valve: The aortic valve is tricuspid. Aortic valve regurgitation is not visualized. Mild aortic valve sclerosis is present, with no evidence of aortic valve stenosis. Pulmonic Valve: The pulmonic valve was not well visualized. Pulmonic valve regurgitation is trivial. Aorta: The aortic root is normal in size and structure. Venous: The inferior vena cava is normal in size with greater than 50% respiratory variability, suggesting right atrial pressure of 3 mmHg. IAS/Shunts: No atrial level shunt detected by color flow Doppler.   LEFT VENTRICLE PLAX 2D LVIDd:         3.80 cm     Diastology LVIDs:         2.70 cm     LV e' lateral:   8.49 cm/s LV PW:         1.20 cm     LV E/e' lateral: 6.6 LV IVS:        1.10 cm     LV e' medial:    5.77 cm/s LVOT diam:     2.00 cm     LV E/e' medial:  9.7 LV SV:         62 LV SV Index:   38 LVOT Area:     3.14 cm  LV Volumes (MOD) LV vol d, MOD A2C: 67.0 ml LV vol d, MOD A4C: 71.4 ml LV vol s, MOD A2C: 28.6 ml LV vol s, MOD A4C: 38.2 ml LV SV MOD A2C:     38.4 ml LV SV MOD A4C:     71.4 ml LV SV MOD BP:      35.7 ml RIGHT VENTRICLE             IVC RV S prime:     18.20 cm/s  IVC diam: 1.50 cm TAPSE (M-mode): 2.3 cm LEFT ATRIUM             Index       RIGHT ATRIUM          Index LA diam:        3.50 cm 2.12 cm/m  RA Area:     7.89 cm LA Vol (A2C):   28.1 ml 17.04 ml/m RA Volume:   14.00 ml 8.49 ml/m LA Vol (A4C):   16.2 ml 9.83 ml/m LA Biplane Vol: 22.3 ml 13.53 ml/m  AORTIC VALVE LVOT Vmax:   105.00 cm/s LVOT Vmean:  67.000 cm/s LVOT VTI:    0.197 m  AORTA Ao Root diam: 3.00 cm Ao Asc diam:  3.20 cm MITRAL VALVE MV Area (PHT): 4.15 cm    SHUNTS MV Decel Time: 183 msec    Systemic VTI:  0.20 m MV E velocity: 55.70 cm/s  Systemic Diam: 2.00 cm MV A velocity: 99.00 cm/s MV E/A ratio:  0.56 Dorris Carnes MD Electronically signed by Dorris Carnes MD Signature Date/Time: 08/02/2019/2:54:24 PM    Final     Cardiac Studies   Echocardiogram 08/02/19: 1. Left ventricular There is mild left ventricular hypertrophy. Apical  window is forshortened. The LVEF is approximately 50 to 55% with apical  hypokinesis.  2. Right ventricular systolic function is normal. The right ventricular  size is normal.  3. The mitral valve is normal in structure. Trivial mitral valve  regurgitation.  4. The aortic valve is tricuspid. Aortic valve regurgitation is not  visualized. Mild aortic valve sclerosis is present, with no evidence of  aortic valve stenosis.  5. The inferior vena cava is normal in size with greater  than 50%  respiratory variability, suggesting right atrial pressure of 3 mmHg.   Patient Profile     69 y.o. male with a hx of hepatitis C infection s/p Harvoni, PAD on pletal, HTN, and tobacco abuse, who presented to the ED with SOB, chest pain, back pain, abdominal pain, and urinary incontinence. Imaging this admission is concerning for metastatic cancer possibly 2/2 gastric primary with large volume ascites and lesions in the spine and scull base. Cardiology is following for the evaluation of elevated troponin and abnormal EKG.   Assessment & Plan    1. Chest pain, abnormal EKG, and elevated troponins in a patient with severe atherosclerosis noted on CT this admission: patient presented with chest pain and SOB in the setting of acute anemia with Hgb 5.9. HsTrop trend:31>261>1913. Echo 08/02/19 showed EF 50-55% with apical hypokinesis, mild LVH, and no significant valvular abnormalities. Prior to this, stress echo 02/2018 was normal. EKG showed diffuse STD, felt to be 2/2 anemia. Symptoms felt to be atypical for cardiac disease. Repeat EKG today with ongoing STD. Given recent findings c/f metastatic cancer, he was felt to be a poor candidate for invasive ischemic work-up at this time as risks of cath likely outweigh benefits. Discussed initiation of medical management today - patient wishes to wait until tomorrow after more testing is completed today - Anticipate starting carvedilol 3.129m BID tomorrow if patient is agreeable - Anticipate starting atorvastatin 831mdaily for LDL 124 06/2019 tomorrow if patient is agreeable - Consider addition of a aspirin if Hgb stabilizes.   2. Metastatic cancer: patient presented with recent onset back pain and urinary incontinence. Found to have scattered lesions throughout spine and skull base of unclear etiology on imaging this admission. Seen by neurosurgery who was c/f cord compression given dense paraparesis and possible paraplegia though difficult to assess  given patient cooperation. RadOnc was consulted for possible emergent XRT, though decision made to pursue a stepwise approach in favor of obtaining further pathologic information before embarking on possible palliative XRT to T-spine.  - Continue management per primary team, oncology, RadOnc, and neurosurgery - Palliative care to see today for goals of care discussion  3. Anemia: Hgb 5.9 on admission (11.9 at baseline 07/21/19), up to 8.5 yesterday following transfusion of 2u PRBC. Receiving a 3rd unit this morning. Goal to maintain Hgb >8 given #1.  - Continue management per primary team  4. PAD: [facility] arterial occlusive disease with ABI of 0.3 on right. Does not appear to have had any intervention - on pletal. Favor discontinuing pletal given c/f ischemic heart disease - Plan to start statin tomorrow as above if patient is agreeable.    For questions or updates, please contact [facility] consult [email or web address] for contact info under Cardiology/STEMI.      Signed, KrAbigail ButtsPA-C  08/03/2019, 9:14 AM   [phone]

## 2019-08-03 NOTE — Anesthesia Preprocedure Evaluation (Deleted)
Anesthesia Evaluation  Patient identified by MRN, date of birth, ID band Patient awake  General Assessment Comment:Metastatic cancer, unknown primary  Reviewed: Allergy & Precautions, NPO status , Patient's Chart, lab work & pertinent test results  Airway Mallampati: II  TM Distance: >3 FB Neck ROM: Full    Dental no notable dental hx.    Pulmonary neg pulmonary ROS, Current Smoker,    Pulmonary exam normal breath sounds clear to auscultation       Cardiovascular hypertension, negative cardio ROS Normal cardiovascular exam Rhythm:Regular Rate:Normal     Neuro/Psych negative neurological ROS  negative psych ROS   GI/Hepatic negative GI ROS, (+)   ascites    , Hepatitis -  Endo/Other  Morbid obesity  Renal/GU negative Renal ROS  negative genitourinary   Musculoskeletal negative musculoskeletal ROS (+)   Abdominal   Peds negative pediatric ROS (+)  Hematology  (+) anemia , thrombocytopenia   Anesthesia Other Findings   Reproductive/Obstetrics negative OB ROS                             Anesthesia Physical Anesthesia Plan  ASA: IV  Anesthesia Plan: MAC   Post-op Pain Management:    Induction: Intravenous  PONV Risk Score and Plan:   Airway Management Planned: Nasal Cannula  Additional Equipment:   Intra-op Plan:   Post-operative Plan:   Informed Consent: I have reviewed the patients History and Physical, chart, labs and discussed the procedure including the risks, benefits and alternatives for the proposed anesthesia with the patient or authorized representative who has indicated his/her understanding and acceptance.     Dental advisory given  Plan Discussed with: CRNA and Surgeon  Anesthesia Plan Comments:         Anesthesia Quick Evaluation

## 2019-08-03 NOTE — Progress Notes (Signed)
Patient is having continuous formed black tarry stools.

## 2019-08-03 NOTE — Progress Notes (Signed)
Patient completed 1 unit PRBC, tolerated well. 1 Unit plasma infusing at this time.

## 2019-08-03 NOTE — Progress Notes (Signed)
I called to try to engage with the patient's wife by phone but had to leave her a voicemail. Dr. Lisbeth Renshaw consulted with this patient overnight. Initially it was thought that the patient still had neurologic function, deminished, yet preserved. When Dr. Lisbeth Renshaw saw him last night, it appeared by examination that he had flaccid paralysis of the lower extremities. In discussion, the patient has apparently had this severity of loss of function since last week. Due to this, Dr. Lisbeth Renshaw is doubtful that there would be a role for radiotherapy to improve neurologic function. Rather, any radiotherapy would be for intention of palliating active GI bleed, or painful bony disease. We will follow along to see next steps as the patient was unable to have his EGD procedure today. If he does have a diagnosis of malignancy made in the near future, and has progressive symptoms that would warrant considering palliative radiotherapy we'd be happy to intervene. I believe that palliative medicine was being asked to weigh in as well along with recommendations from Dr. Marin Olp. We will follow along but do not anticipate a role for urgent radiotherapy to the thoracic spine at this time.     Carola Rhine, PAC

## 2019-08-03 NOTE — Progress Notes (Signed)
Bladder scanner showed 543ml of urine.  IO cath performed per order from Dr. Lonny Prude with 650 ml of malodorous amber colored urine out.  Patient stated he was unable to feel any part of the procedure, including relief from emptying his bladder.  UA and UC sent per orders.  Will continue to monitor.

## 2019-08-03 NOTE — Progress Notes (Signed)
CRITICAL VALUE ALERT  Critical Value: hemoglobin 6.9  Date & Time Notied:  08/03/19   Provider Notified: Dr. Lonny Prude on floor and aware  Orders Received/Actions taken: follow-up H&H placed

## 2019-08-03 NOTE — Progress Notes (Addendum)
PROGRESS NOTE    Dylan Kaufman  ZOX:[id number] DOB: 09-09-1950 DOA: 07/16/2019 PCP: Venia Carbon, MD   Brief Narrative: Dylan Kaufman is a 69 y.o. male with a history of hypertension, hepatitis C, ascites. Patient presented secondary to dyspnea, abdominal pain and chest pain with evidence of severe anemia and an NSTEMI. Imaging was obtained and was significant for likely metastatic cancer to bone of unknown primary (probable GI).   Assessment & Plan:   Principal Problem:   Symptomatic anemia Active Problems:   Hypertension   Metastatic bone tumor (HCC)   NSTEMI (non-ST elevated myocardial infarction) (HCC)   Symptomatic anemia Blood loss anemia GI bleed Concern for GI bleeding. FOBT obtained in ED and was negative. Patient is having melanotic stools currently. Hemoglobin trended down -Goal hemoglobin >8 in setting of NSTEMI; transfuse 1 unit PRBC -GI recommendations: plan for EGD which was held on 6/29 secondary to NSTEMI  NSTEMI Diffuse ST depression on EKG with significantly elevated troponin (peak of 1913 now trending down). Cardiology consulted and is recommending medical management in setting of comorbidities; specifically severe anemia that would prohibit the use of antiplatelet therapy. 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 Unknown primary but concern for likely GI primary. Medical oncology consulted. -Medical oncology recommendations: PSA obtained with recommendations to only consider treatment if prostate cancer was primary diagnosis  Thoracic spine cord compression Secondary to metastatic lesion. Patient evaluated by neurosurgery who recommended emergent radiation. Radiation oncology evaluated and recommend emergent XRT, however, plan for step-wise approach is being considered  after coordination with patient and wife. Patient with associated LE weakness and appears to have urinary retention. Started on IV decadron -Continue decadron IV -Radiation oncology recommendations  Abdominal distension Secondary to large volume ascites with findings concerning for complicated fluid collection -US Paracentesis w/ Cytology, cell count  Suprapubic tenderness/distension In/out ordered with significant urine retention. Urine culture ordered, however, likely this is secondary to cord compression. -In/Out if unable to void -If continues to require in/out, will place foley catheter  Elevated BUN In setting of possible upper GI bleeding  Elevated AST/ALP Unsure of etiology. MRI suggests possible hemochromatosis. Small cystic structure seen on CT abdomen/pelvis. Trending down. Iron, ferritin ordered. -Hepatitis C RNA quant  History of hepatitis C Treated with Harvoni per chart.   Thrombocytopenia Likely contributing to GI bleed. Recommendations to transfuse for platelets < 20,000 -Per oncology  Essential hypertension On amlodipine as an outpatient. Blood pressure uncontrolled -Restart home amlodipine 10 mg daily   DVT prophylaxis: SCDs Code Status:   Code Status: Full Code Family Communication: None at bedside Disposition Plan: Discharge home vs SNF likely in several days as his medical issues continue workup   Consultants:   Cardiology  Gastroenterology  Medical oncology  Procedures:   TRANSTHORACIC ECHOCARDIOGRAM (08/02/2019) IMPRESSIONS    1. Left ventricular There is mild left ventricular hypertrophy. Apical  window is forshortened. The LVEF is approximately 50 to 55% with apical  hypokinesis.  2. Right ventricular systolic function is normal. The right ventricular  size is normal.  3. The mitral valve is normal in structure. Trivial mitral valve  regurgitation.  4. The aortic valve is tricuspid. Aortic valve regurgitation is not  visualized.  Mild aortic valve sclerosis is present, with no evidence of  aortic valve stenosis.  5. The inferior vena cava is normal in size with greater than 50%  respiratory variability, suggesting right atrial pressure of 3 mmHg.  Antimicrobials:  None    Subjective: Patient states that he wants ice chips. He is thirst. Also reports abdominal discomfort, mostly suprapubic. No chest pain  Objective: Vitals:   08/03/19 0700 08/03/19 0703 08/03/19 0730 08/03/19 0936  BP: (!) 166/89  (!) 164/87 (!) 163/91  Pulse: 98  98 (!) 104  Resp: 19  19 20   Temp: (!) 97.4 F (36.3 C)  98.3 F (36.8 C) 99.1 F (37.3 C)  TempSrc: Oral  Oral Oral  SpO2: 97%  96% 94%  Weight:  128.7 kg    Height:        Intake/Output Summary (Last 24 hours) at 08/03/2019 1243 Last data filed at 08/03/2019 1046 Gross per 24 hour  Intake 1781.15 ml  Output 400 ml  Net 1381.15 ml   Filed Weights   07/16/2019 2124 08/03/19 0703  Weight: 57.6 kg 128.7 kg    Examination:  General exam: Appears calm and comfortable Respiratory system: Clear to auscultation. Respiratory effort normal. Cardiovascular system: S1 & S2 heard, RRR. No murmurs, rubs, gallops or clicks. Gastrointestinal system: Abdomen is significantly distended, soft and tender especially in the supraprubic region. No organomegaly or masses felt. Decreased bowel sounds heard. Central nervous system: Alert and oriented. Musculoskeletal: No edema. No calf tenderness Skin: No cyanosis. No rashes Psychiatry: Judgement and insight appear normal. Mood & affect appropriate.     Data Reviewed: I have personally reviewed following labs and imaging studies  CBC Lab Results  Component Value Date   WBC 10.5 08/03/2019   RBC 2.37 (L) 08/03/2019   HGB 6.9 (LL) 08/03/2019   HCT 20.2 (L) 08/03/2019   MCV 85.2 08/03/2019   MCH 29.1 08/03/2019   PLT 44 (L) 08/03/2019   MCHC 34.2 08/03/2019   RDW 15.7 (H) 08/03/2019   LYMPHSABS 3.3 07/21/2019   MONOABS 1.3 (H)  07/21/2019   EOSABS 0.3 07/21/2019   BASOSABS 0.1 54/00/8676     Last metabolic panel Lab Results  Component Value Date   NA 145 08/03/2019   K 4.1 08/03/2019   CL 111 08/03/2019   CO2 23 08/03/2019   BUN 56 (H) 08/03/2019   CREATININE 0.82 08/03/2019   GLUCOSE 134 (H) 08/03/2019   GFRNONAA >60 08/03/2019   GFRAA >60 08/03/2019   CALCIUM 8.2 (L) 08/03/2019   PROT 6.4 (L) 08/03/2019   ALBUMIN 2.9 (L) 08/03/2019   BILITOT 0.8 08/03/2019   ALKPHOS 918 (H) 08/03/2019   AST 63 (H) 08/03/2019   ALT 28 08/03/2019   ANIONGAP 11 08/03/2019    CBG (last 3)  Recent Labs    08/02/19 0756 08/02/19 1650  GLUCAP 126* 125*     GFR: Estimated Creatinine Clearance: 108 mL/min (by C-G formula based on SCr of 0.82 mg/dL).  Coagulation Profile: Recent Labs  Lab 08/02/19 0421  INR 1.7*    Recent Results (from the past 240 hour(s))  SARS Coronavirus 2 by RT PCR (hospital order, performed in [facility] hospital lab) Nasopharyngeal Nasopharyngeal Swab     Status: None   Collection Time: 08/02/19  5:52 AM   Specimen: Nasopharyngeal Swab  Result Value Ref Range Status   SARS Coronavirus 2 NEGATIVE NEGATIVE Final    Comment: (NOTE) SARS-CoV-2 target nucleic acids are NOT DETECTED.  The SARS-CoV-2 RNA is generally detectable in upper and lower respiratory specimens during the acute phase of infection. The lowest concentration of SARS-CoV-2 viral copies this assay can detect is 250 copies /  mL. A negative result does not preclude SARS-CoV-2 infection and should not be used as the sole basis for treatment or other patient management decisions.  A negative result may occur with improper specimen collection / handling, submission of specimen other than nasopharyngeal swab, presence of viral mutation(s) within the areas targeted by this assay, and inadequate number of viral copies (<250 copies / mL). A negative result must be combined with clinical observations, patient history, and  epidemiological information.  Fact Sheet for Patients:   StrictlyIdeas.no  Fact Sheet for Healthcare Providers: BankingDealers.co.za  This test is not yet approved or  cleared by the [place] FDA and has been authorized for detection and/or diagnosis of SARS-CoV-2 by FDA under an Emergency Use Authorization (EUA).  This EUA will remain in effect (meaning this test can be used) for the duration of the COVID-19 declaration under Section 564(b)(1) of the Act, 21 U.S.C. section 360bbb-3(b)(1), unless the authorization is terminated or revoked sooner.  Performed at [facility], [place] [address] [place], [place] [address]         Radiology Studies: CT Angio Chest PE W and/or Wo Contrast  Result Date: 08/02/2019 CLINICAL DATA:  Chest pain and shortness of breath.  Constipation EXAM: CT ANGIOGRAPHY CHEST CT ABDOMEN AND PELVIS WITH CONTRAST TECHNIQUE: Multidetector CT imaging of the chest was performed using the standard protocol during bolus administration of intravenous contrast. Multiplanar CT image reconstructions and MIPs were obtained to evaluate the vascular anatomy. Multidetector CT imaging of the abdomen and pelvis was performed using the standard protocol during bolus administration of intravenous contrast. CONTRAST:  152m OMNIPAQUE IOHEXOL 350 MG/ML SOLN COMPARISON:  None. FINDINGS: CTA CHEST FINDINGS Cardiovascular: Normal heart size. No pericardial effusion. There may be hypertrophy of the left ventricle. Heavily calcified aorta, great vessels, and coronary arteries. 40% narrowing of the brachiocephalic artery on coronal reformats. Assessment of the left common carotid atherosclerotic narrowing is limited by streak artifact. That no pulmonary artery filling defect. Mediastinum/Nodes: Negative for adenopathy or mass Lungs/Pleura: Airway plugging in the left lower lobe. Bilateral lower lobe atelectasis and small volume left  pleural effusion. Musculoskeletal: Patchy sclerosis in the sternum and throughout the spine primarily worrisome for metastatic disease. Remote bilateral rib fractures. T7 superior endplate deformity without visible acute fracture line. Review of the MIP images confirms the above findings. CT ABDOMEN and PELVIS FINDINGS Hepatobiliary: Noted history of hepatitis-C. No overt cirrhotic changes. Tiny cystic density in the central liver.No evidence of biliary obstruction or stone. Pancreas: Unremarkable. Spleen: Unremarkable. Adrenals/Urinary Tract: Negative adrenals. No hydronephrosis or stone. Right upper pole cortical scarring. Distended urinary bladder without focal abnormality. Stomach/Bowel: No obstruction or visible inflammation. Prominent enhancement and redundant appearance to the gastric antral mucosa. Moderate stool retention. Vascular/Lymphatic: No acute vascular abnormality. Heavily calcified aorta and iliacs with advanced bilateral iliac and common femoral/superficial femoral artery narrowings. No mass or adenopathy. Reproductive:Probable TURP defect. Other: Large volume ascites which does not appear equally distributed throughout the abdomen. There may be some peritoneal thickening and smooth linear enhancement, no discrete peritoneal nodule. Adenopathy in the gastrohepatic ligament measuring up to 13 mm short axis. Musculoskeletal: Patchy sclerosis in the lumbar spine. There are T12, L1, L2, and L3 superior endplate deformities, age-indeterminate. Review of the MIP images confirms the above findings. IMPRESSION: Abdominal CT: 1. Numerous sclerotic foci in the spine worrisome for metastatic disease. Prominent intrathecal enhancement, suggest MR follow-up if there is myelopathy or polyneuropathy. There are multiple superior endplate fractures, age-indeterminate. 2. A  gastric primary is considered given the prominent antral mucosa and adenopathy in the gastrohepatic ligament. 3. Large volume ascites. No  discrete peritoneal nodule, but there is some loculation and probable peritoneal thickening, suggest paracentesis with cytology. 4. Severe atherosclerosis. Chest CTA: 1. Negative for pulmonary embolism. 2. Bilateral lower lobe atelectasis with bronchial plugging and trace effusion on the left. 3. Severe atherosclerosis. Electronically Signed   By: Monte Fantasia M.D.   On: 08/02/2019 04:43   MR BRAIN W WO CONTRAST  Result Date: 08/02/2019 CLINICAL DATA:  69 year old male with evidence of spinal metastatic disease on CT Chest, Abdomen, and Pelvis today. EXAM: MRI HEAD WITHOUT AND WITH CONTRAST TECHNIQUE: Multiplanar, multiecho pulse sequences of the brain and surrounding structures were obtained without and with intravenous contrast. CONTRAST:  78m GADAVIST GADOBUTROL 1 MMOL/ML IV SOLN COMPARISON:  No prior brain imaging. FINDINGS: Brain: There is subtle abnormal diffusion in white matter near the left splenium of the corpus callosum (series 7 image 42). Mild T2 and FLAIR hyperintensity is associated, with scattered additional patchy white matter T2 and FLAIR hyperintensity elsewhere. Similar T2 and FLAIR hyperintensity in the left basal ganglia compatible with chronic lacunar infarct. No other abnormal diffusion. No cortical encephalomalacia identified. There is a chronic microhemorrhage in the left thalamus. Questionable similar chronic microhemorrhage in the right deep cerebellar nuclei. Motion degraded postcontrast imaging throughout the brain. No definite abnormal parenchymal enhancement. No midline shift, mass effect, ventriculomegaly, extra-axial collection or acute intracranial hemorrhage. Cervicomedullary junction and pituitary are within normal limits. Vascular: Major intracranial vascular flow voids are preserved. Postcontrast images are motion degraded but the superior sagittal sinus, transverse and sigmoid sinuses appear to be grossly patent and enhancing. Skull and upper cervical spine: Abnormal  marrow signal in the occipital condyles and throughout the visible cervical spine. Superimposed discrete and expansile bone mass at the left lower temporal bone (series 9, image 18, series 5, image 52. Associated mild left mastoid effusion. No other destructive skull lesion identified. Sinuses/Orbits: Negative. Other: Right mastoids remain clear.  No scalp soft tissue lesion. IMPRESSION: 1. Positive for bone metastases to the skull base and visible cervical spine, including a mildly expansile lesion of the left temporal bone. Mild associated left mastoid effusion. 2. Substantially degraded postcontrast images of the brain with no obvious brain metastasis. A repeat staging Brain MRI when the patient can better cooperate would be valuable. 3. Evidence of small vessel disease with possible recent white matter ischemia near the left splenium. Electronically Signed   By: HGenevie AnnM.D.   On: 08/02/2019 14:22   MR THORACIC SPINE W WO CONTRAST  Result Date: 08/02/2019 CLINICAL DATA:  Back pain. Abnormal spine on CT. Chronic hepatitis C infection. EXAM: MRI THORACIC WITHOUT AND WITH CONTRAST TECHNIQUE: Multiplanar and multiecho pulse sequences of the thoracic spine were obtained without and with intravenous contrast. CONTRAST:  626mGADAVIST GADOBUTROL 1 MMOL/ML IV SOLN COMPARISON:  CT chest abdomen pelvis earlier today. FINDINGS: MRI THORACIC SPINE FINDINGS Alignment:  Normal Vertebrae: Diffusely abnormal bone marrow throughout the thoracic spine with diffuse low signal intensity suggesting a diffuse infiltrative process. Scattered sclerotic lesions are present at several levels on CT. Mild compression fracture of T5 with bone marrow edema. Mild compression fractures of T7, T8, T9, T10 with bone marrow edema. Mild compression fracture T12 with mild bone marrow edema. There is heterogeneous enhancement of the bone marrow involving these fractures. Cord: There is spinal stenosis due to epidural soft tissue thickening from  T6 through T10. This  is causing cord flattening and mild cord compression. No cord signal abnormality is identified. Paraspinal and other soft tissues: Epidural soft tissue thickening and enhancement from T6 through T10. There appears to be infiltration of the posterior epidural fat with enhancement. This is predominately posterior but appears circumferential. This is contributing to moderate spinal stenosis Disc levels: Negative for disc protrusion.  Negative for disc space infection. IMPRESSION: Diffusely abnormal bone marrow throughout the thoracic spine suggesting an infiltrative process such as lymphoma, metastatic disease, or multiple myeloma. Multiple pathologic fractures that appear recent involving T5, T7, T9, T10, T12 Extensive epidural thickening and enhancement in the midthoracic spine causing compression of the spinal cord. 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 These results were called by telephone at the time of interpretation on 08/02/2019 at 2:48 pm to provider Matcha, who verbally acknowledged these results. Electronically Signed   By: Franchot Gallo M.D.   On: 08/02/2019 14:51   MR Lumbar Spine W Wo Contrast  Result Date: 08/02/2019 CLINICAL DATA:  Back pain.  Abnormal CT of the spine. EXAM: MRI LUMBAR SPINE WITHOUT AND WITH CONTRAST TECHNIQUE: Multiplanar and multiecho pulse sequences of the lumbar spine were obtained without and with intravenous contrast. CONTRAST:  15m GADAVIST GADOBUTROL 1 MMOL/ML IV SOLN COMPARISON:  CT  chest abdomen pelvis 08/02/2019 FINDINGS: Segmentation:  Normal Alignment:  Normal Vertebrae: Diffusely abnormal bone marrow which is markedly low signal on T1. Mild superior and inferior endplate fractures are present throughout the lumbar spine. There is mild edema involving the T12 and L2 fracture which may be recent. No focal mass lesion is identified. Conus medullaris and cauda equina: Conus extends to the L1-2 level. Conus and cauda equina appear normal. Paraspinal and other soft  tissues: Moderate ascites. Liver and spleen are diffusely low signal. Patient has a history of chronic hepatitis C infection. No paraspinous adenopathy. Disc levels: L1-2: Negative L2-3: Negative L3-4: Small central disc protrusion.  Negative for stenosis L4-5: Small central disc protrusion.  Negative for stenosis L5-S1: Negative IMPRESSION: Diffusely abnormal bone marrow. The patient has severe anemia. No focal lesion identified. Possible lymphoma or myeloproliferative disorder. Also consider widespread metastatic disease or myeloma. Consider bone marrow biopsy. Multiple mild compression fractures throughout the lumbar spine. There is mild edema at T12 and L2 suggesting recent fractures. These are mild. Moderate ascites. Diffusely low signal in the liver and spleen which may reflect hemochromatosis. Electronically Signed   By: CFranchot GalloM.D.   On: 08/02/2019 14:25   CT ABDOMEN PELVIS W CONTRAST  Result Date: 08/02/2019 CLINICAL DATA:  Chest pain and shortness of breath.  Constipation EXAM: CT ANGIOGRAPHY CHEST CT ABDOMEN AND PELVIS WITH CONTRAST TECHNIQUE: Multidetector CT imaging of the chest was performed using the standard protocol during bolus administration of intravenous contrast. Multiplanar CT image reconstructions and MIPs were obtained to evaluate the vascular anatomy. Multidetector CT imaging of the abdomen and pelvis was performed using the standard protocol during bolus administration of intravenous contrast. CONTRAST:  1048mOMNIPAQUE IOHEXOL 350 MG/ML SOLN COMPARISON:  None. FINDINGS: CTA CHEST FINDINGS Cardiovascular: Normal heart size. No pericardial effusion. There may be hypertrophy of the left ventricle. Heavily calcified aorta, great vessels, and coronary arteries. 40% narrowing of the brachiocephalic artery on coronal reformats. Assessment of the left common carotid atherosclerotic narrowing is limited by streak artifact. That no pulmonary artery filling defect. Mediastinum/Nodes:  Negative for adenopathy or mass Lungs/Pleura: Airway plugging in the left lower lobe. Bilateral lower lobe atelectasis and small volume left pleural effusion. Musculoskeletal: Patchy sclerosis  in the sternum and throughout the spine primarily worrisome for metastatic disease. Remote bilateral rib fractures. T7 superior endplate deformity without visible acute fracture line. Review of the MIP images confirms the above findings. CT ABDOMEN and PELVIS FINDINGS Hepatobiliary: Noted history of hepatitis-C. No overt cirrhotic changes. Tiny cystic density in the central liver.No evidence of biliary obstruction or stone. Pancreas: Unremarkable. Spleen: Unremarkable. Adrenals/Urinary Tract: Negative adrenals. No hydronephrosis or stone. Right upper pole cortical scarring. Distended urinary bladder without focal abnormality. Stomach/Bowel: No obstruction or visible inflammation. Prominent enhancement and redundant appearance to the gastric antral mucosa. Moderate stool retention. Vascular/Lymphatic: No acute vascular abnormality. Heavily calcified aorta and iliacs with advanced bilateral iliac and common femoral/superficial femoral artery narrowings. No mass or adenopathy. Reproductive:Probable TURP defect. Other: Large volume ascites which does not appear equally distributed throughout the abdomen. There may be some peritoneal thickening and smooth linear enhancement, no discrete peritoneal nodule. Adenopathy in the gastrohepatic ligament measuring up to 13 mm short axis. Musculoskeletal: Patchy sclerosis in the lumbar spine. There are T12, L1, L2, and L3 superior endplate deformities, age-indeterminate. Review of the MIP images confirms the above findings. IMPRESSION: Abdominal CT: 1. Numerous sclerotic foci in the spine worrisome for metastatic disease. Prominent intrathecal enhancement, suggest MR follow-up if there is myelopathy or polyneuropathy. There are multiple superior endplate fractures, age-indeterminate. 2. A  gastric primary is considered given the prominent antral mucosa and adenopathy in the gastrohepatic ligament. 3. Large volume ascites. No discrete peritoneal nodule, but there is some loculation and probable peritoneal thickening, suggest paracentesis with cytology. 4. Severe atherosclerosis. Chest CTA: 1. Negative for pulmonary embolism. 2. Bilateral lower lobe atelectasis with bronchial plugging and trace effusion on the left. 3. Severe atherosclerosis. Electronically Signed   By: Monte Fantasia M.D.   On: 08/02/2019 04:43   ECHOCARDIOGRAM COMPLETE  Result Date: 08/02/2019    ECHOCARDIOGRAM REPORT   Patient Name:   [facility] Date of Exam: 08/02/2019 Medical Rec #:  [id number]    Height:       66.0 in Accession #:    [id number]   Weight:       127.0 lb Date of Birth:  [birth year]   BSA:          1.649 m Patient Age:    20 years     BP:           128/72 mmHg Patient Gender: M            HR:           104 bpm. Exam Location:  Inpatient Procedure: 2D Echo, Cardiac Doppler and Color Doppler Indications:    122-I22.9 Subsequent ST elevation (STEM) and non-ST elevation                 (NSTEMI) myocardial infarction  History:        Patient has no prior history of Echocardiogram examinations.                 Signs/Symptoms:Chest Pain and Dyspnea; Risk Factors:Current                 Smoker and Hypertension. HEP C.  Sonographer:    Roseanna Rainbow RDCS Referring Phys: [id number] [facility] A THOMAS  Sonographer Comments: Technically difficult study due to poor echo windows. Patient in pain and in supine position. Patient asked me to stop pressing on him in apical region. IMPRESSIONS  1. Left ventricular There is mild left ventricular hypertrophy. Apical window is forshortened. The  LVEF is approximately 50 to 55% with apical hypokinesis.  2. Right ventricular systolic function is normal. The right ventricular size is normal.  3. The mitral valve is normal in structure. Trivial mitral valve regurgitation.  4. The aortic valve is  tricuspid. Aortic valve regurgitation is not visualized. Mild aortic valve sclerosis is present, with no evidence of aortic valve stenosis.  5. The inferior vena cava is normal in size with greater than 50% respiratory variability, suggesting right atrial pressure of 3 mmHg. FINDINGS  Left Ventricle: Left ventricular ejection fraction, by estimation, is 50 to 55%. The left ventricle has low normal function. The left ventricle demonstrates regional wall motion abnormalities. The left ventricular internal cavity size was normal in size. There is mild left ventricular hypertrophy. Left ventricular diastolic parameters are consistent with Grade I diastolic dysfunction (impaired relaxation). Right Ventricle: The right ventricular size is normal. Right vetricular wall thickness was not assessed. Right ventricular systolic function is normal. Left Atrium: Left atrial size was normal in size. Right Atrium: Right atrial size was normal in size. Pericardium: There is no evidence of pericardial effusion. Mitral Valve: The mitral valve is normal in structure. Trivial mitral valve regurgitation. Tricuspid Valve: The tricuspid valve is normal in structure. Tricuspid valve regurgitation is trivial. Aortic Valve: The aortic valve is tricuspid. Aortic valve regurgitation is not visualized. Mild aortic valve sclerosis is present, with no evidence of aortic valve stenosis. Pulmonic Valve: The pulmonic valve was not well visualized. Pulmonic valve regurgitation is trivial. Aorta: The aortic root is normal in size and structure. Venous: The inferior vena cava is normal in size with greater than 50% respiratory variability, suggesting right atrial pressure of 3 mmHg. IAS/Shunts: No atrial level shunt detected by color flow Doppler.  LEFT VENTRICLE PLAX 2D LVIDd:         3.80 cm     Diastology LVIDs:         2.70 cm     LV e' lateral:   8.49 cm/s LV PW:         1.20 cm     LV E/e' lateral: 6.6 LV IVS:        1.10 cm     LV e' medial:     5.77 cm/s LVOT diam:     2.00 cm     LV E/e' medial:  9.7 LV SV:         62 LV SV Index:   38 LVOT Area:     3.14 cm  LV Volumes (MOD) LV vol d, MOD A2C: 67.0 ml LV vol d, MOD A4C: 71.4 ml LV vol s, MOD A2C: 28.6 ml LV vol s, MOD A4C: 38.2 ml LV SV MOD A2C:     38.4 ml LV SV MOD A4C:     71.4 ml LV SV MOD BP:      35.7 ml RIGHT VENTRICLE             IVC RV S prime:     18.20 cm/s  IVC diam: 1.50 cm TAPSE (M-mode): 2.3 cm LEFT ATRIUM             Index       RIGHT ATRIUM          Index LA diam:        3.50 cm 2.12 cm/m  RA Area:     7.89 cm LA Vol (A2C):   28.1 ml 17.04 ml/m RA Volume:   14.00 ml 8.49 ml/m LA Vol (A4C):  16.2 ml 9.83 ml/m LA Biplane Vol: 22.3 ml 13.53 ml/m  AORTIC VALVE LVOT Vmax:   105.00 cm/s LVOT Vmean:  67.000 cm/s LVOT VTI:    0.197 m  AORTA Ao Root diam: 3.00 cm Ao Asc diam:  3.20 cm MITRAL VALVE MV Area (PHT): 4.15 cm    SHUNTS MV Decel Time: 183 msec    Systemic VTI:  0.20 m MV E velocity: 55.70 cm/s  Systemic Diam: 2.00 cm MV A velocity: 99.00 cm/s MV E/A ratio:  0.56 Dorris Carnes MD Electronically signed by Dorris Carnes MD Signature Date/Time: 08/02/2019/2:54:24 PM    Final         Scheduled Meds: . sodium chloride   Intravenous Once  . sodium chloride   Intravenous Once  . bisacodyl  10 mg Oral Once  . dexamethasone (DECADRON) injection  10 mg Intravenous Q12H  . polyethylene glycol  17 g Oral BID  . sodium chloride flush  3 mL Intravenous Q12H   Continuous Infusions: . pantoprazole (PROTONIX) IV 80 mg (08/03/19 1032)     LOS: 1 day     Cordelia Poche, MD [facility] 08/03/2019, 12:43 PM  If 7PM-7AM, please contact night-coverage [email or web address]

## 2019-08-03 NOTE — Procedures (Signed)
Ultrasound-guided diagnostic and therapeutic paracentesis performed yielding 2.4 liters of hazy,amber fluid. No immediate complications. A portion of the fluid was sent to the lab for preordered studies. EBL none.

## 2019-08-03 NOTE — Progress Notes (Signed)
[name] is certainly more awake today than yesterday when I saw him in the emergency room.  Unfortunately, I do still think that he has a grasp as to what is going on with him.  There is not a lot of agitation with him.  He has complaints that I am not sure he really understands.  He does not understand why he cannot go outside and I think smoke.  I am not sure how things will go with doing procedures on him.  Again, we are dealing with a widespread malignancy.  I looked at his blood smear under the microscope yesterday.  He clearly has bone marrow involvement by what ever is going on.  He has nucleated red blood cells.  He has some schistocytes.  He has early white blood cells.  He has some large platelets.  All this is highly consistent with a marrow infiltrative process.  The ONLY malignancy that we could potentially treat for him is prostate cancer.  We sent off a PSA.  I would be surprised if this is the problem.  He has a large amount of ascites.  His ascites really needs to be removed.  However, I am not sure the patient will allow for that to happen.  He cannot move his legs.  He has a cord compression.  Again, I agree with neurosurgery and radiation oncology that treatment is probably not going to help him.  He is on steroids.  Maybe is the steroids that is causing some of the agitate him.  He is going for an upper endoscopy today.  I will check a prealbumin on him.  I suspect that the prealbumin is going to be less than 5.  Again, the ONLY malignancy that I would even consider try to treat would be prostate cancer.  Marrow involvement by malignancy is a very ominous sign.  Again, I do not think he really understands what is going on with him.  I probably will have to try to talk to his wife.  Hopefully, the upper endoscopy will find a primary.  Hopefully he will allow for the paracentesis.  I very much appreciate the care that he will receive from the staff on 4 E.  Lattie Haw,  MD  Jeneen Rinks 1:5-7

## 2019-08-03 NOTE — Progress Notes (Addendum)
Patient ID: Dylan Kaufman, male   DOB: 01-23-1951, 69 y.o.   MRN: [id number]    Progress Note   Subjective   Day # 2  CC; anemia, chest pain,abnormal  CT abd/pelvis concerning for malignancy  Labs - hgb 6.9 post one unit   Tbili 0.8, alk phos 918,ast 63   Trop rise [phone]  Persistent ST dep on EKG per cardiology this am.  No complaints of chest pain today, abdomen feels very full and uncomfortable.  Patient unhappy because he has been n.p.o.-begging for  some liquids   Objective   Vital signs in last 24 hours: Temp:  [97.4 F (36.3 C)-99.1 F (37.3 C)] 99.1 F (37.3 C) (06/29 0936) Pulse Rate:  [98-110] 104 (06/29 0936) Resp:  [12-24] 20 (06/29 0936) BP: (122-170)/(78-92) 163/91 (06/29 0936) SpO2:  [94 %-100 %] 94 % (06/29 0936) Weight:  [128.7 kg] 128.7 kg (06/29 0703) Last BM Date: 08/03/19 General:   AA male in NAD Heart:  Regular rate and rhythm; no murmurs Lungs: Respirations even and unlabored, lungs CTA bilaterally Abdomen: Distended but not tense mild diffuse tenderness. Normal bowel sounds.  Positive ascites Extremities:  Without edema. Neurologic:  Alert and oriented,  grossly normal neurologically. Psych:  Cooperative. Normal mood and affect.  Intake/Output from previous day: 06/28 0701 - 06/29 0700 In: 2131.2 [I.V.:1107.2; Blood:924; IV Piggyback:100] Out: -  Intake/Output this shift: Total I/O In: 280 [Blood:280] Out: 400 [Urine:400]  Lab Results: Recent Labs    08/02/19 0242 08/02/19 1500 08/03/19 1100  WBC 13.3*  --  10.5  HGB 5.9* 8.5* 6.9*  HCT 18.3* 26.2* 20.2*  PLT 74*  --  44*   BMET Recent Labs    08/02/19 0242 08/03/19 1100  NA 139 145  K 3.3* 4.1  CL 100 111  CO2 23 23  GLUCOSE 150* 134*  BUN 36* 56*  CREATININE 0.95 0.82  CALCIUM 8.7* 8.2*   LFT Recent Labs    08/03/19 1100  PROT 6.4*  ALBUMIN 2.9*  AST 63*  ALT 28  ALKPHOS 918*  BILITOT 0.8   PT/INR Recent Labs    08/02/19 0421  LABPROT 19.1*  INR  1.7*    Studies/Results: CT Angio Chest PE W and/or Wo Contrast  Result Date: 08/02/2019 CLINICAL DATA:  Chest pain and shortness of breath.  Constipation EXAM: CT ANGIOGRAPHY CHEST CT ABDOMEN AND PELVIS WITH CONTRAST TECHNIQUE: Multidetector CT imaging of the chest was performed using the standard protocol during bolus administration of intravenous contrast. Multiplanar CT image reconstructions and MIPs were obtained to evaluate the vascular anatomy. Multidetector CT imaging of the abdomen and pelvis was performed using the standard protocol during bolus administration of intravenous contrast. CONTRAST:  143m OMNIPAQUE IOHEXOL 350 MG/ML SOLN COMPARISON:  None. FINDINGS: CTA CHEST FINDINGS Cardiovascular: Normal heart size. No pericardial effusion. There may be hypertrophy of the left ventricle. Heavily calcified aorta, great vessels, and coronary arteries. 40% narrowing of the brachiocephalic artery on coronal reformats. Assessment of the left common carotid atherosclerotic narrowing is limited by streak artifact. That no pulmonary artery filling defect. Mediastinum/Nodes: Negative for adenopathy or mass Lungs/Pleura: Airway plugging in the left lower lobe. Bilateral lower lobe atelectasis and small volume left pleural effusion. Musculoskeletal: Patchy sclerosis in the sternum and throughout the spine primarily worrisome for metastatic disease. Remote bilateral rib fractures. T7 superior endplate deformity without visible acute fracture line. Review of the MIP images confirms the above findings. CT ABDOMEN and PELVIS FINDINGS Hepatobiliary: Noted history of hepatitis-C.  No overt cirrhotic changes. Tiny cystic density in the central liver.No evidence of biliary obstruction or stone. Pancreas: Unremarkable. Spleen: Unremarkable. Adrenals/Urinary Tract: Negative adrenals. No hydronephrosis or stone. Right upper pole cortical scarring. Distended urinary bladder without focal abnormality. Stomach/Bowel: No  obstruction or visible inflammation. Prominent enhancement and redundant appearance to the gastric antral mucosa. Moderate stool retention. Vascular/Lymphatic: No acute vascular abnormality. Heavily calcified aorta and iliacs with advanced bilateral iliac and common femoral/superficial femoral artery narrowings. No mass or adenopathy. Reproductive:Probable TURP defect. Other: Large volume ascites which does not appear equally distributed throughout the abdomen. There may be some peritoneal thickening and smooth linear enhancement, no discrete peritoneal nodule. Adenopathy in the gastrohepatic ligament measuring up to 13 mm short axis. Musculoskeletal: Patchy sclerosis in the lumbar spine. There are T12, L1, L2, and L3 superior endplate deformities, age-indeterminate. Review of the MIP images confirms the above findings. IMPRESSION: Abdominal CT: 1. Numerous sclerotic foci in the spine worrisome for metastatic disease. Prominent intrathecal enhancement, suggest MR follow-up if there is myelopathy or polyneuropathy. There are multiple superior endplate fractures, age-indeterminate. 2. A gastric primary is considered given the prominent antral mucosa and adenopathy in the gastrohepatic ligament. 3. Large volume ascites. No discrete peritoneal nodule, but there is some loculation and probable peritoneal thickening, suggest paracentesis with cytology. 4. Severe atherosclerosis. Chest CTA: 1. Negative for pulmonary embolism. 2. Bilateral lower lobe atelectasis with bronchial plugging and trace effusion on the left. 3. Severe atherosclerosis. Electronically Signed   By: Monte Fantasia M.D.   On: 08/02/2019 04:43   MR BRAIN W WO CONTRAST  Result Date: 08/02/2019 CLINICAL DATA:  69 year old male with evidence of spinal metastatic disease on CT Chest, Abdomen, and Pelvis today. EXAM: MRI HEAD WITHOUT AND WITH CONTRAST TECHNIQUE: Multiplanar, multiecho pulse sequences of the brain and surrounding structures were obtained  without and with intravenous contrast. CONTRAST:  39m GADAVIST GADOBUTROL 1 MMOL/ML IV SOLN COMPARISON:  No prior brain imaging. FINDINGS: Brain: There is subtle abnormal diffusion in white matter near the left splenium of the corpus callosum (series 7 image 42). Mild T2 and FLAIR hyperintensity is associated, with scattered additional patchy white matter T2 and FLAIR hyperintensity elsewhere. Similar T2 and FLAIR hyperintensity in the left basal ganglia compatible with chronic lacunar infarct. No other abnormal diffusion. No cortical encephalomalacia identified. There is a chronic microhemorrhage in the left thalamus. Questionable similar chronic microhemorrhage in the right deep cerebellar nuclei. Motion degraded postcontrast imaging throughout the brain. No definite abnormal parenchymal enhancement. No midline shift, mass effect, ventriculomegaly, extra-axial collection or acute intracranial hemorrhage. Cervicomedullary junction and pituitary are within normal limits. Vascular: Major intracranial vascular flow voids are preserved. Postcontrast images are motion degraded but the superior sagittal sinus, transverse and sigmoid sinuses appear to be grossly patent and enhancing. Skull and upper cervical spine: Abnormal marrow signal in the occipital condyles and throughout the visible cervical spine. Superimposed discrete and expansile bone mass at the left lower temporal bone (series 9, image 18, series 5, image 52. Associated mild left mastoid effusion. No other destructive skull lesion identified. Sinuses/Orbits: Negative. Other: Right mastoids remain clear.  No scalp soft tissue lesion. IMPRESSION: 1. Positive for bone metastases to the skull base and visible cervical spine, including a mildly expansile lesion of the left temporal bone. Mild associated left mastoid effusion. 2. Substantially degraded postcontrast images of the brain with no obvious brain metastasis. A repeat staging Brain MRI when the patient can  better cooperate would be valuable. 3. Evidence of  small vessel disease with possible recent white matter ischemia near the left splenium. Electronically Signed   By: Genevie Ann M.D.   On: 08/02/2019 14:22   MR THORACIC SPINE W WO CONTRAST  Result Date: 08/02/2019 CLINICAL DATA:  Back pain. Abnormal spine on CT. Chronic hepatitis C infection. EXAM: MRI THORACIC WITHOUT AND WITH CONTRAST TECHNIQUE: Multiplanar and multiecho pulse sequences of the thoracic spine were obtained without and with intravenous contrast. CONTRAST:  34m GADAVIST GADOBUTROL 1 MMOL/ML IV SOLN COMPARISON:  CT chest abdomen pelvis earlier today. FINDINGS: MRI THORACIC SPINE FINDINGS Alignment:  Normal Vertebrae: Diffusely abnormal bone marrow throughout the thoracic spine with diffuse low signal intensity suggesting a diffuse infiltrative process. Scattered sclerotic lesions are present at several levels on CT. Mild compression fracture of T5 with bone marrow edema. Mild compression fractures of T7, T8, T9, T10 with bone marrow edema. Mild compression fracture T12 with mild bone marrow edema. There is heterogeneous enhancement of the bone marrow involving these fractures. Cord: There is spinal stenosis due to epidural soft tissue thickening from T6 through T10. This is causing cord flattening and mild cord compression. No cord signal abnormality is identified. Paraspinal and other soft tissues: Epidural soft tissue thickening and enhancement from T6 through T10. There appears to be infiltration of the posterior epidural fat with enhancement. This is predominately posterior but appears circumferential. This is contributing to moderate spinal stenosis Disc levels: Negative for disc protrusion.  Negative for disc space infection. IMPRESSION: Diffusely abnormal bone marrow throughout the thoracic spine suggesting an infiltrative process such as lymphoma, metastatic disease, or multiple myeloma. Multiple pathologic fractures that appear recent  involving T5, T7, T9, T10, T12 Extensive epidural thickening and enhancement in the midthoracic spine causing compression of the spinal cord. 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 These results were called by telephone at the time of interpretation on 08/02/2019 at 2:48 pm to provider Matcha, who verbally acknowledged these results. Electronically Signed   By: CFranchot GalloM.D.   On: 08/02/2019 14:51   MR Lumbar Spine W Wo Contrast  Result Date: 08/02/2019 CLINICAL DATA:  Back pain.  Abnormal CT of the spine. EXAM: MRI LUMBAR SPINE WITHOUT AND WITH CONTRAST TECHNIQUE: Multiplanar and multiecho pulse sequences of the lumbar spine were obtained without and with intravenous contrast. CONTRAST:  625mGADAVIST GADOBUTROL 1 MMOL/ML IV SOLN COMPARISON:  CT  chest abdomen pelvis 08/02/2019 FINDINGS: Segmentation:  Normal Alignment:  Normal Vertebrae: Diffusely abnormal bone marrow which is markedly low signal on T1. Mild superior and inferior endplate fractures are present throughout the lumbar spine. There is mild edema involving the T12 and L2 fracture which may be recent. No focal mass lesion is identified. Conus medullaris and cauda equina: Conus extends to the L1-2 level. Conus and cauda equina appear normal. Paraspinal and other soft tissues: Moderate ascites. Liver and spleen are diffusely low signal. Patient has a history of chronic hepatitis C infection. No paraspinous adenopathy. Disc levels: L1-2: Negative L2-3: Negative L3-4: Small central disc protrusion.  Negative for stenosis L4-5: Small central disc protrusion.  Negative for stenosis L5-S1: Negative IMPRESSION: Diffusely abnormal bone marrow. The patient has severe anemia. No focal lesion identified. Possible lymphoma or myeloproliferative disorder. Also consider widespread metastatic disease or myeloma. Consider bone marrow biopsy. Multiple mild compression fractures throughout the lumbar spine. There is mild edema at T12 and L2 suggesting recent fractures. These  are mild. Moderate ascites. Diffusely low signal in the liver and spleen which may reflect hemochromatosis. Electronically Signed  By: Franchot Gallo M.D.   On: 08/02/2019 14:25   CT ABDOMEN PELVIS W CONTRAST  Result Date: 08/02/2019 CLINICAL DATA:  Chest pain and shortness of breath.  Constipation EXAM: CT ANGIOGRAPHY CHEST CT ABDOMEN AND PELVIS WITH CONTRAST TECHNIQUE: Multidetector CT imaging of the chest was performed using the standard protocol during bolus administration of intravenous contrast. Multiplanar CT image reconstructions and MIPs were obtained to evaluate the vascular anatomy. Multidetector CT imaging of the abdomen and pelvis was performed using the standard protocol during bolus administration of intravenous contrast. CONTRAST:  156m OMNIPAQUE IOHEXOL 350 MG/ML SOLN COMPARISON:  None. FINDINGS: CTA CHEST FINDINGS Cardiovascular: Normal heart size. No pericardial effusion. There may be hypertrophy of the left ventricle. Heavily calcified aorta, great vessels, and coronary arteries. 40% narrowing of the brachiocephalic artery on coronal reformats. Assessment of the left common carotid atherosclerotic narrowing is limited by streak artifact. That no pulmonary artery filling defect. Mediastinum/Nodes: Negative for adenopathy or mass Lungs/Pleura: Airway plugging in the left lower lobe. Bilateral lower lobe atelectasis and small volume left pleural effusion. Musculoskeletal: Patchy sclerosis in the sternum and throughout the spine primarily worrisome for metastatic disease. Remote bilateral rib fractures. T7 superior endplate deformity without visible acute fracture line. Review of the MIP images confirms the above findings. CT ABDOMEN and PELVIS FINDINGS Hepatobiliary: Noted history of hepatitis-C. No overt cirrhotic changes. Tiny cystic density in the central liver.No evidence of biliary obstruction or stone. Pancreas: Unremarkable. Spleen: Unremarkable. Adrenals/Urinary Tract: Negative  adrenals. No hydronephrosis or stone. Right upper pole cortical scarring. Distended urinary bladder without focal abnormality. Stomach/Bowel: No obstruction or visible inflammation. Prominent enhancement and redundant appearance to the gastric antral mucosa. Moderate stool retention. Vascular/Lymphatic: No acute vascular abnormality. Heavily calcified aorta and iliacs with advanced bilateral iliac and common femoral/superficial femoral artery narrowings. No mass or adenopathy. Reproductive:Probable TURP defect. Other: Large volume ascites which does not appear equally distributed throughout the abdomen. There may be some peritoneal thickening and smooth linear enhancement, no discrete peritoneal nodule. Adenopathy in the gastrohepatic ligament measuring up to 13 mm short axis. Musculoskeletal: Patchy sclerosis in the lumbar spine. There are T12, L1, L2, and L3 superior endplate deformities, age-indeterminate. Review of the MIP images confirms the above findings. IMPRESSION: Abdominal CT: 1. Numerous sclerotic foci in the spine worrisome for metastatic disease. Prominent intrathecal enhancement, suggest MR follow-up if there is myelopathy or polyneuropathy. There are multiple superior endplate fractures, age-indeterminate. 2. A gastric primary is considered given the prominent antral mucosa and adenopathy in the gastrohepatic ligament. 3. Large volume ascites. No discrete peritoneal nodule, but there is some loculation and probable peritoneal thickening, suggest paracentesis with cytology. 4. Severe atherosclerosis. Chest CTA: 1. Negative for pulmonary embolism. 2. Bilateral lower lobe atelectasis with bronchial plugging and trace effusion on the left. 3. Severe atherosclerosis. Electronically Signed   By: JMonte FantasiaM.D.   On: 08/02/2019 04:43   ECHOCARDIOGRAM COMPLETE  Result Date: 08/02/2019    ECHOCARDIOGRAM REPORT   [facility]am: 08/02/2019 Medical Rec #:  [id number]    Height:       66.0 in Accession #:    [id number]  Weight:       127.0 lb Date of Birth:  120-Oct-1952  BSA:          1.649 m Patient Age:    89years     BP:           128/72 mmHg Patient  Gender: M            HR:           104 bpm. Exam Location:  Inpatient Procedure: 2D Echo, Cardiac Doppler and Color Doppler Indications:    122-I22.9 Subsequent ST elevation (STEM) and non-ST elevation                 (NSTEMI) myocardial infarction  History:        Patient has no prior history of Echocardiogram examinations.                 Signs/Symptoms:Chest Pain and Dyspnea; Risk Factors:Current                 Smoker and Hypertension. HEP C.  Sonographer:    Roseanna Rainbow RDCS Referring Phys: [id number] [facility] A [facility]  Sonographer Comments: Technically difficult study due to poor echo windows. Patient in pain and in supine position. Patient asked me to stop pressing on him in apical region. IMPRESSIONS  1. Left ventricular There is mild left ventricular hypertrophy. Apical window is forshortened. The LVEF is approximately 50 to 55% with apical hypokinesis.  2. Right ventricular systolic function is normal. The right ventricular size is normal.  3. The mitral valve is normal in structure. Trivial mitral valve regurgitation.  4. The aortic valve is tricuspid. Aortic valve regurgitation is not visualized. Mild aortic valve sclerosis is present, with no evidence of aortic valve stenosis.  5. The inferior vena cava is normal in size with greater than 50% respiratory variability, suggesting right atrial pressure of 3 mmHg. FINDINGS  Left Ventricle: Left ventricular ejection fraction, by estimation, is 50 to 55%. The left ventricle has low normal function. The left ventricle demonstrates regional wall motion abnormalities. The left ventricular internal cavity size was normal in size. There is mild left ventricular hypertrophy. Left ventricular diastolic parameters are consistent with Grade I diastolic dysfunction (impaired relaxation).  Right Ventricle: The right ventricular size is normal. Right vetricular wall thickness was not assessed. Right ventricular systolic function is normal. Left Atrium: Left atrial size was normal in size. Right Atrium: Right atrial size was normal in size. Pericardium: There is no evidence of pericardial effusion. Mitral Valve: The mitral valve is normal in structure. Trivial mitral valve regurgitation. Tricuspid Valve: The tricuspid valve is normal in structure. Tricuspid valve regurgitation is trivial. Aortic Valve: The aortic valve is tricuspid. Aortic valve regurgitation is not visualized. Mild aortic valve sclerosis is present, with no evidence of aortic valve stenosis. Pulmonic Valve: The pulmonic valve was not well visualized. Pulmonic valve regurgitation is trivial. Aorta: The aortic root is normal in size and structure. Venous: The inferior vena cava is normal in size with greater than 50% respiratory variability, suggesting right atrial pressure of 3 mmHg. IAS/Shunts: No atrial level shunt detected by color flow Doppler.  LEFT VENTRICLE PLAX 2D LVIDd:         3.80 cm     Diastology LVIDs:         2.70 cm     LV e' lateral:   8.49 cm/s LV PW:         1.20 cm     LV E/e' lateral: 6.6 LV IVS:        1.10 cm     LV e' medial:    5.77 cm/s LVOT diam:     2.00 cm     LV E/e' medial:  9.7 LV SV:  62 LV SV Index:   38 LVOT Area:     3.14 cm  LV Volumes (MOD) LV vol d, MOD A2C: 67.0 ml LV vol d, MOD A4C: 71.4 ml LV vol s, MOD A2C: 28.6 ml LV vol s, MOD A4C: 38.2 ml LV SV MOD A2C:     38.4 ml LV SV MOD A4C:     71.4 ml LV SV MOD BP:      35.7 ml RIGHT VENTRICLE             IVC RV S prime:     18.20 cm/s  IVC diam: 1.50 cm TAPSE (M-mode): 2.3 cm LEFT ATRIUM             Index       RIGHT ATRIUM          Index LA diam:        3.50 cm 2.12 cm/m  RA Area:     7.89 cm LA Vol (A2C):   28.1 ml 17.04 ml/m RA Volume:   14.00 ml 8.49 ml/m LA Vol (A4C):   16.2 ml 9.83 ml/m LA Biplane Vol: 22.3 ml 13.53 ml/m  AORTIC  VALVE LVOT Vmax:   105.00 cm/s LVOT Vmean:  67.000 cm/s LVOT VTI:    0.197 m  AORTA Ao Root diam: 3.00 cm Ao Asc diam:  3.20 cm MITRAL VALVE MV Area (PHT): 4.15 cm    SHUNTS MV Decel Time: 183 msec    Systemic VTI:  0.20 m MV E velocity: 55.70 cm/s  Systemic Diam: 2.00 cm MV A velocity: 99.00 cm/s MV E/A ratio:  0.56 Dorris Carnes MD Electronically signed by Dorris Carnes MD Signature Date/Time: 08/02/2019/2:54:24 PM    Final        Assessment / Plan:    #41 69 year old African-American male, who presented to the emergency room with complaints of chest pain and shortness of breath, in setting of 2 to 79-monthhistory of abdominal pain, distention, inability to eat other than liquids.  Extensive work-up yesterday revealed numerous sclerotic foci Worrisome for metastatic disease, prominent antral mucosa and adenopathy in the gastrohepatic ligament, large volume of ascites no discrete peritoneal nodules and severe atherosclerosis  MRI, thoracic and lumbar showing multiple pathologic fractures Diffusely abnormal bone marrow throughout the thoracic spine suggesting infiltrative process i.e. lymphoma metastatic disease or multiple myeloma Also with bone mets to the skull base and cervical spine  #2 profound anemia-improved status post transfusion x1, still quite low  #3+ troponins and ST depression on EKG.  Cardiology is evaluating, due to significant comorbidities outlined above initial plan not for cardiac cath, and to initiate medical management.  Plan; patient was tentatively scheduled for upper endoscopy today.  Due to persistently elevated troponins have canceled for today. Possibly rescheduled for tomorrow depending on all of his parameters Will keep n.p.o. in a.m. Clear liquid diet and supplements today  Patient has also been scheduled for paracentesis today per medicine service.         Active Problems:   Symptomatic anemia   Metastatic bone tumor ([place]     LOS: 1 day   Amy  Esterwood PA-C 08/03/2019, 12:25 PM    Attending physician's note   I have taken an interval history, reviewed the chart and examined the patient. I agree with the Advanced Practitioner's note, impression and recommendations.   Elevated troponin ~2000 associated with chest pain and ST depression on EKG Hemoglobin 6.9  Will hold off EGD until he is more stable  from hemodynamic standpoint We will need cardiology clearance prior to EGD and anesthesia  Imaging suggestive of metastatic malignancy, unknown primary  Underwent diagnostic paracentesis, await cytology  Dr. Tarri Glenn will be covering  GI inpatient service  tomorrow  K. Denzil Magnuson , MD [phone]

## 2019-08-04 ENCOUNTER — Encounter (HOSPITAL_COMMUNITY): Admission: EM | Disposition: E | Payer: Self-pay | Source: Home / Self Care | Attending: Infectious Disease

## 2019-08-04 DIAGNOSIS — Z515 Encounter for palliative care: Secondary | ICD-10-CM

## 2019-08-04 DIAGNOSIS — G893 Neoplasm related pain (acute) (chronic): Secondary | ICD-10-CM

## 2019-08-04 DIAGNOSIS — Z7189 Other specified counseling: Secondary | ICD-10-CM

## 2019-08-04 LAB — TYPE AND SCREEN
ABO/RH(D): AB POS
ABO/RH(D): AB POS
Antibody Screen: NEGATIVE
Antibody Screen: NEGATIVE
Unit division: 0
Unit division: 0

## 2019-08-04 LAB — PROTEIN ELECTROPHORESIS, SERUM
A/G Ratio: 0.8 (ref 0.7–1.7)
Albumin ELP: 2.5 g/dL — ABNORMAL LOW (ref 2.9–4.4)
Alpha-1-Globulin: 0.5 g/dL — ABNORMAL HIGH (ref 0.0–0.4)
Alpha-2-Globulin: 0.9 g/dL (ref 0.4–1.0)
Beta Globulin: 1.2 g/dL (ref 0.7–1.3)
Gamma Globulin: 0.6 g/dL (ref 0.4–1.8)
Globulin, Total: 3.3 g/dL (ref 2.2–3.9)
Total Protein ELP: 5.8 g/dL — ABNORMAL LOW (ref 6.0–8.5)

## 2019-08-04 LAB — CBC
HCT: 21.7 % — ABNORMAL LOW (ref 39.0–52.0)
Hemoglobin: 7.5 g/dL — ABNORMAL LOW (ref 13.0–17.0)
MCH: 29.6 pg (ref 26.0–34.0)
MCHC: 34.6 g/dL (ref 30.0–36.0)
MCV: 85.8 fL (ref 80.0–100.0)
Platelets: 37 10*3/uL — ABNORMAL LOW (ref 150–400)
RBC: 2.53 MIL/uL — ABNORMAL LOW (ref 4.22–5.81)
RDW: 15.5 % (ref 11.5–15.5)
WBC: 6.1 10*3/uL (ref 4.0–10.5)
nRBC: 51.6 % — ABNORMAL HIGH (ref 0.0–0.2)

## 2019-08-04 LAB — LACTATE DEHYDROGENASE: LDH: 508 U/L — ABNORMAL HIGH (ref 98–192)

## 2019-08-04 LAB — PREPARE FRESH FROZEN PLASMA: Unit division: 0

## 2019-08-04 LAB — BPAM FFP
Blood Product Expiration Date: 202107042359
ISSUE DATE / TIME: 202106290704
Unit Type and Rh: 8400

## 2019-08-04 LAB — PATHOLOGIST SMEAR REVIEW

## 2019-08-04 LAB — RETICULOCYTES
Immature Retic Fract: 26.8 % — ABNORMAL HIGH (ref 2.3–15.9)
RBC.: 2.5 MIL/uL — ABNORMAL LOW (ref 4.22–5.81)
Retic Count, Absolute: 28.3 10*3/uL (ref 19.0–186.0)
Retic Ct Pct: 1.1 % (ref 0.4–3.1)

## 2019-08-04 LAB — BPAM RBC
Blood Product Expiration Date: 202107092359
Blood Product Expiration Date: 202107092359
ISSUE DATE / TIME: 202106290108
ISSUE DATE / TIME: 202106291903
Unit Type and Rh: 6200
Unit Type and Rh: 6200

## 2019-08-04 LAB — PREALBUMIN: Prealbumin: 12.5 mg/dL — ABNORMAL LOW (ref 18–38)

## 2019-08-04 LAB — CEA: CEA: 397 ng/mL — ABNORMAL HIGH (ref 0.0–4.7)

## 2019-08-04 LAB — GLUCOSE, CAPILLARY: Glucose-Capillary: 136 mg/dL — ABNORMAL HIGH (ref 70–99)

## 2019-08-04 SURGERY — CANCELLED PROCEDURE
Anesthesia: Monitor Anesthesia Care

## 2019-08-04 MED ORDER — HALOPERIDOL 0.5 MG PO TABS
0.5000 mg | ORAL_TABLET | ORAL | Status: DC | PRN
  Filled 2019-08-04: qty 1

## 2019-08-04 MED ORDER — GLYCOPYRROLATE 1 MG PO TABS
1.0000 mg | ORAL_TABLET | ORAL | Status: DC | PRN
  Filled 2019-08-04: qty 1

## 2019-08-04 MED ORDER — SODIUM CHLORIDE 0.9 % IV SOLN
510.0000 mg | Freq: Once | INTRAVENOUS | Status: AC
  Administered 2019-08-04: 510 mg via INTRAVENOUS
  Filled 2019-08-04: qty 510

## 2019-08-04 MED ORDER — ONDANSETRON 4 MG PO TBDP: 4.0000 mg | ORAL_TABLET | Freq: Four times a day (QID) | ORAL | Status: DC | PRN

## 2019-08-04 MED ORDER — HYDROMORPHONE HCL 1 MG/ML IJ SOLN
1.0000 mg | INTRAMUSCULAR | Status: DC | PRN
  Administered 2019-08-04 (×3): 1 mg via INTRAVENOUS
  Filled 2019-08-04 (×3): qty 1

## 2019-08-04 MED ORDER — ACETAMINOPHEN 650 MG RE SUPP: 650.0000 mg | Freq: Four times a day (QID) | RECTAL | Status: DC | PRN

## 2019-08-04 MED ORDER — HYDROMORPHONE BOLUS VIA INFUSION
0.2000 mg | INTRAVENOUS | Status: DC | PRN
  Administered 2019-08-04 (×2): 0.2 mg via INTRAVENOUS
  Filled 2019-08-04: qty 1

## 2019-08-04 MED ORDER — GLYCOPYRROLATE 0.2 MG/ML IJ SOLN
0.2000 mg | INTRAMUSCULAR | Status: DC | PRN
  Filled 2019-08-04: qty 1

## 2019-08-04 MED ORDER — ONDANSETRON HCL 4 MG/2ML IJ SOLN
4.0000 mg | INTRAMUSCULAR | Status: DC | PRN
  Administered 2019-08-04: 4 mg via INTRAVENOUS
  Filled 2019-08-04: qty 2

## 2019-08-04 MED ORDER — ONDANSETRON HCL 4 MG/2ML IJ SOLN: 4.0000 mg | Freq: Four times a day (QID) | INTRAMUSCULAR | Status: DC | PRN

## 2019-08-04 MED ORDER — BOOST / RESOURCE BREEZE PO LIQD CUSTOM: 1.0000 | Freq: Three times a day (TID) | ORAL | Status: DC

## 2019-08-04 MED ORDER — ZOLEDRONIC ACID 4 MG/5ML IV CONC
4.0000 mg | Freq: Once | INTRAVENOUS | Status: AC
  Administered 2019-08-04: 4 mg via INTRAVENOUS
  Filled 2019-08-04: qty 5

## 2019-08-04 MED ORDER — LORAZEPAM 2 MG/ML IJ SOLN: 1.0000 mg | INTRAMUSCULAR | Status: DC | PRN

## 2019-08-04 MED ORDER — ONDANSETRON HCL 4 MG PO TABS: 4.0000 mg | ORAL_TABLET | ORAL | Status: DC | PRN

## 2019-08-04 MED ORDER — LORAZEPAM 1 MG PO TABS: 1.0000 mg | ORAL_TABLET | ORAL | Status: DC | PRN

## 2019-08-04 MED ORDER — LORAZEPAM 2 MG/ML PO CONC: 1.0000 mg | ORAL | Status: DC | PRN

## 2019-08-04 MED ORDER — SODIUM CHLORIDE 0.9 % IV SOLN
1.0000 mg/h | INTRAVENOUS | Status: DC
  Administered 2019-08-04: 1 mg/h via INTRAVENOUS
  Filled 2019-08-04: qty 5

## 2019-08-04 MED ORDER — HALOPERIDOL LACTATE 2 MG/ML PO CONC
0.5000 mg | ORAL | Status: DC | PRN
  Filled 2019-08-04: qty 0.3

## 2019-08-04 MED ORDER — HYDROMORPHONE HCL 1 MG/ML IJ SOLN: 1.0000 mg | INTRAMUSCULAR | Status: DC | PRN

## 2019-08-04 MED ORDER — HALOPERIDOL LACTATE 5 MG/ML IJ SOLN: 0.5000 mg | INTRAMUSCULAR | Status: DC | PRN

## 2019-08-04 MED ORDER — ACETAMINOPHEN 325 MG PO TABS: 650.0000 mg | ORAL_TABLET | Freq: Four times a day (QID) | ORAL | Status: DC | PRN

## 2019-08-04 SURGICAL SUPPLY — 14 items

## 2019-08-05 ENCOUNTER — Ambulatory Visit: Payer: Medicare HMO | Admitting: Gastroenterology

## 2019-08-05 LAB — HCV RNA QUANT: HCV Quantitative: NOT DETECTED IU/mL (ref >50)

## 2019-08-05 LAB — URINE CULTURE: Culture: NO GROWTH

## 2019-08-05 LAB — CYTOLOGY - NON PAP

## 2019-08-05 NOTE — Progress Notes (Signed)
I revisited patient to evaluate him and provide support to the patient's wife who is at the bedside.  Patient is severely distressed, very uncomfortable, heart rate is 150, on nonrebreather and patient not able to communicate.  Nursing staff changing linens that is soaked with bloody stool. I provided more support to the patient's wife, we discussed in detail about how he is nearing the death and probably uncomfortable with hypoxia and high heart rate.  Recommended different options including giving him more frequent opiates and keeping him comfortable and waiting for natural death.   Plan: Comfort care measures.  DNR/DNI in place. Discontinue telemetry.  No labs.  No invasive testings. All symptom control medications available.  Patient is unable to remain comfortable with every 2 hours Dilaudid, will start patient on Dilaudid infusion 1 mg/h with gradual plan to increase to keep him comfortable.  Treat for anxiety and agitation.  Treat for secretions. Anticipate hospital death. RN to pronounce death if happens in the hospital. Unrestricted visitor access.

## 2019-08-05 NOTE — Progress Notes (Signed)
Pt at this time resting more quieter since IV Dilaudid Drip was started. Resp remain labored but less intense and Pt with less moaning and crying out. Maintain current plan of care for Pt.

## 2019-08-05 NOTE — Progress Notes (Signed)
°   08/12/19 1229  Assess: MEWS Score  Temp 97.9 F (36.6 C)  BP (!) 145/82  Pulse Rate (!) 116  Resp (!) 27  SpO2 90 %  Assess: MEWS Score  MEWS Temp 0  MEWS Systolic 0  MEWS Pulse 2  MEWS RR 2  MEWS LOC 0  MEWS Score 4  MEWS Score Color Red  Assess: if the MEWS score is Yellow or Red  Were vital signs taken at a resting state? Yes  Focused Assessment Documented focused assessment  Early Detection of Sepsis Score *See Row Information* Low  MEWS guidelines implemented *See Row Information* Yes  Treat  MEWS Interventions Administered prn meds/treatments;Escalated (See documentation below)  Take Vital Signs  Increase Vital Sign Frequency  Red: Q 1hr X 4 then Q 4hr X 4, if remains red, continue Q 4hrs  Escalate  MEWS: Escalate Red: discuss with charge nurse/RN and provider, consider discussing with RRT  Notify: Charge Nurse/RN  Name of Charge Nurse/RN Notified Abby D RN  Date Charge Nurse/RN Notified Aug 12, 2019  Time Charge Nurse/RN Notified 1236  Notify: Provider  Provider Name/Title Dr. Sloan Leiter  Date Provider Notified 08/12/19  Time Provider Notified 1237  Notification Type Page  Notification Reason Change in status

## 2019-08-05 NOTE — Progress Notes (Signed)
Pt with Red MEWS. Respiratory pattern has increased along with heart rate. Pt with c/o abdominal pain and per orders PRN IV Dilaudid given. Dr. Sloan Leiter in to reassess Pt. Dr. Talen Cocking also has been in to see Pt and spoken to pt's wife

## 2019-08-05 NOTE — Progress Notes (Signed)
This note also relates to the following rows which could not be included: ECG Heart Rate - Cannot attach notes to unvalidated device data Resp - Cannot attach notes to unvalidated device data   

## 2019-08-05 NOTE — Progress Notes (Signed)
Patient ID: Dylan Kaufman, male   DOB: Jun 22, 1950, 69 y.o.   MRN: [id number]    Progress Note   Subjective   Day # 2  CC CP/ SOB /abdominal  pain, weight loss  Hgb7.5 after 2 units total, plts 44  Para fluid- WBC 1700/ neutr -2 , culture pend, cytology Pend  CEA 400  Patient had been taken down to end of this morning, however due to his significant elevation of troponins yesterday and ST changes, consistent with non-STE MI- we had decided to delay endoscopic evaluation.    Objective   Vital signs in last 24 hours: Temp:  [97.3 F (36.3 C)-99.1 F (37.3 C)] 97.5 F (36.4 C) (06/30 0518) Pulse Rate:  [100-107] 107 (06/30 0518) Resp:  [14-20] 18 (06/30 0518) BP: (144-185)/(87-100) 144/87 (06/30 0518) SpO2:  [94 %-99 %] 94 % (06/30 0518) Weight:  [53.5 kg] 53.5 kg (06/30 0545) Last BM Date: 08/03/19   Intake/Output from previous day: 06/29 0701 - 06/30 0700 In: 1250 [P.O.:480; Blood:570; IV Piggyback:200] Out: 1025 [Urine:1025] Intake/Output this shift: No intake/output data recorded.  Lab Results: Recent Labs    08/02/19 0242 08/02/19 1500 08/03/19 1100 08/03/19 1401 2019-08-30 0747  WBC 13.3*  --  10.5  --  6.1  HGB 5.9*   < > 6.9* 7.0* 7.5*  HCT 18.3*   < > 20.2* 21.5* 21.7*  PLT 74*  --  44*  --  37*   < > = values in this interval not displayed.   BMET Recent Labs    08/02/19 0242 08/03/19 1100  NA 139 145  K 3.3* 4.1  CL 100 111  CO2 23 23  GLUCOSE 150* 134*  BUN 36* 56*  CREATININE 0.95 0.82  CALCIUM 8.7* 8.2*   LFT Recent Labs    08/03/19 1100  PROT 6.4*  ALBUMIN 2.9*  AST 63*  ALT 28  ALKPHOS 918*  BILITOT 0.8   PT/INR Recent Labs    08/02/19 0421  LABPROT 19.1*  INR 1.7*    Studies/Results: MR BRAIN W WO CONTRAST  Result Date: 08/02/2019 CLINICAL DATA:  69 year old male with evidence of spinal metastatic disease on CT Chest, Abdomen, and Pelvis today. EXAM: MRI HEAD WITHOUT AND WITH CONTRAST TECHNIQUE: Multiplanar, multiecho  pulse sequences of the brain and surrounding structures were obtained without and with intravenous contrast. CONTRAST:  57m GADAVIST GADOBUTROL 1 MMOL/ML IV SOLN COMPARISON:  No prior brain imaging. FINDINGS: Brain: There is subtle abnormal diffusion in white matter near the left splenium of the corpus callosum (series 7 image 42). Mild T2 and FLAIR hyperintensity is associated, with scattered additional patchy white matter T2 and FLAIR hyperintensity elsewhere. Similar T2 and FLAIR hyperintensity in the left basal ganglia compatible with chronic lacunar infarct. No other abnormal diffusion. No cortical encephalomalacia identified. There is a chronic microhemorrhage in the left thalamus. Questionable similar chronic microhemorrhage in the right deep cerebellar nuclei. Motion degraded postcontrast imaging throughout the brain. No definite abnormal parenchymal enhancement. No midline shift, mass effect, ventriculomegaly, extra-axial collection or acute intracranial hemorrhage. Cervicomedullary junction and pituitary are within normal limits. Vascular: Major intracranial vascular flow voids are preserved. Postcontrast images are motion degraded but the superior sagittal sinus, transverse and sigmoid sinuses appear to be grossly patent and enhancing. Skull and upper cervical spine: Abnormal marrow signal in the occipital condyles and throughout the visible cervical spine. Superimposed discrete and expansile bone mass at the left lower temporal bone (series 9, image 18, series 5, image 52. Associated  mild left mastoid effusion. No other destructive skull lesion identified. Sinuses/Orbits: Negative. Other: Right mastoids remain clear.  No scalp soft tissue lesion. IMPRESSION: 1. Positive for bone metastases to the skull base and visible cervical spine, including a mildly expansile lesion of the left temporal bone. Mild associated left mastoid effusion. 2. Substantially degraded postcontrast images of the brain with no  obvious brain metastasis. A repeat staging Brain MRI when the patient can better cooperate would be valuable. 3. Evidence of small vessel disease with possible recent white matter ischemia near the left splenium. Electronically Signed   By: Genevie Ann M.D.   On: 08/02/2019 14:22   MR THORACIC SPINE W WO CONTRAST  Result Date: 08/02/2019 CLINICAL DATA:  Back pain. Abnormal spine on CT. Chronic hepatitis C infection. EXAM: MRI THORACIC WITHOUT AND WITH CONTRAST TECHNIQUE: Multiplanar and multiecho pulse sequences of the thoracic spine were obtained without and with intravenous contrast. CONTRAST:  70m GADAVIST GADOBUTROL 1 MMOL/ML IV SOLN COMPARISON:  CT chest abdomen pelvis earlier today. FINDINGS: MRI THORACIC SPINE FINDINGS Alignment:  Normal Vertebrae: Diffusely abnormal bone marrow throughout the thoracic spine with diffuse low signal intensity suggesting a diffuse infiltrative process. Scattered sclerotic lesions are present at several levels on CT. Mild compression fracture of T5 with bone marrow edema. Mild compression fractures of T7, T8, T9, T10 with bone marrow edema. Mild compression fracture T12 with mild bone marrow edema. There is heterogeneous enhancement of the bone marrow involving these fractures. Cord: There is spinal stenosis due to epidural soft tissue thickening from T6 through T10. This is causing cord flattening and mild cord compression. No cord signal abnormality is identified. Paraspinal and other soft tissues: Epidural soft tissue thickening and enhancement from T6 through T10. There appears to be infiltration of the posterior epidural fat with enhancement. This is predominately posterior but appears circumferential. This is contributing to moderate spinal stenosis Disc levels: Negative for disc protrusion.  Negative for disc space infection. IMPRESSION: Diffusely abnormal bone marrow throughout the thoracic spine suggesting an infiltrative process such as lymphoma, metastatic disease, or  multiple myeloma. Multiple pathologic fractures that appear recent involving T5, T7, T9, T10, T12 Extensive epidural thickening and enhancement in the midthoracic spine causing compression of the spinal cord. 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 These results were called by telephone at the time of interpretation on 08/02/2019 at 2:48 pm to provider Matcha, who verbally acknowledged these results. Electronically Signed   By: CFranchot GalloM.D.   On: 08/02/2019 14:51   MR Lumbar Spine W Wo Contrast  Result Date: 08/02/2019 CLINICAL DATA:  Back pain.  Abnormal CT of the spine. EXAM: MRI LUMBAR SPINE WITHOUT AND WITH CONTRAST TECHNIQUE: Multiplanar and multiecho pulse sequences of the lumbar spine were obtained without and with intravenous contrast. CONTRAST:  633mGADAVIST GADOBUTROL 1 MMOL/ML IV SOLN COMPARISON:  CT  chest abdomen pelvis 08/02/2019 FINDINGS: Segmentation:  Normal Alignment:  Normal Vertebrae: Diffusely abnormal bone marrow which is markedly low signal on T1. Mild superior and inferior endplate fractures are present throughout the lumbar spine. There is mild edema involving the T12 and L2 fracture which may be recent. No focal mass lesion is identified. Conus medullaris and cauda equina: Conus extends to the L1-2 level. Conus and cauda equina appear normal. Paraspinal and other soft tissues: Moderate ascites. Liver and spleen are diffusely low signal. Patient has a history of chronic hepatitis C infection. No paraspinous adenopathy. Disc levels: L1-2: Negative L2-3: Negative L3-4: Small central disc protrusion.  Negative for  stenosis L4-5: Small central disc protrusion.  Negative for stenosis L5-S1: Negative IMPRESSION: Diffusely abnormal bone marrow. The patient has severe anemia. No focal lesion identified. Possible lymphoma or myeloproliferative disorder. Also consider widespread metastatic disease or myeloma. Consider bone marrow biopsy. Multiple mild compression fractures throughout the lumbar spine.  There is mild edema at T12 and L2 suggesting recent fractures. These are mild. Moderate ascites. Diffusely low signal in the liver and spleen which may reflect hemochromatosis. Electronically Signed   By: Franchot Gallo M.D.   On: 08/02/2019 14:25   US Paracentesis  Result Date: 08/03/2019 INDICATION: Patient with history of hepatitis-C, numerous sclerotic foci in the spine, prominent gastric antral mucosa and adenopathy, ascites. Request made for diagnostic and therapeutic paracentesis. EXAM: ULTRASOUND GUIDED DIAGNOSTIC AND THERAPEUTIC PARACENTESIS MEDICATIONS: None COMPLICATIONS: None immediate. PROCEDURE: Informed written consent was obtained from the patient after a discussion of the risks, benefits and alternatives to treatment. A timeout was performed prior to the initiation of the procedure. Initial ultrasound scanning demonstrates a moderate amount of ascites within the left lower abdominal quadrant. The left lower abdomen was prepped and draped in the usual sterile fashion. 1% lidocaine was used for local anesthesia. Following this, a 6 Fr Safe-T-Centesis catheter was introduced. An ultrasound image was saved for documentation purposes. The paracentesis was performed. The catheter was removed and a dressing was applied. The patient tolerated the procedure well without immediate post procedural complication. FINDINGS: A total of approximately 2.4 liters of hazy, amber fluid was removed. Samples were sent to the laboratory as requested by the clinical team. IMPRESSION: Successful ultrasound-guided diagnostic and therapeutic paracentesis yielding 2.4 liters of peritoneal fluid. Read by: Rowe Robert, PA-C Electronically Signed   By: Jerilynn Mages.  Shick M.D.   On: 08/03/2019 16:02   ECHOCARDIOGRAM COMPLETE  Result Date: 08/02/2019    ECHOCARDIOGRAM REPORT   Patient Name:   [facility] Date of Exam: 08/02/2019 Medical Rec #:  [id number]    Height:       66.0 in Accession #:    [id number]   Weight:       127.0 lb  Date of Birth:  [birth year]   BSA:          1.649 m Patient Age:    45 years     BP:           128/72 mmHg Patient Gender: M            HR:           104 bpm. Exam Location:  Inpatient Procedure: 2D Echo, Cardiac Doppler and Color Doppler Indications:    122-I22.9 Subsequent ST elevation (STEM) and non-ST elevation                 (NSTEMI) myocardial infarction  History:        Patient has no prior history of Echocardiogram examinations.                 Signs/Symptoms:Chest Pain and Dyspnea; Risk Factors:Current                 Smoker and Hypertension. HEP C.  Sonographer:    Roseanna Rainbow RDCS Referring Phys: [id number] [facility] A THOMAS  Sonographer Comments: Technically difficult study due to poor echo windows. Patient in pain and in supine position. Patient asked me to stop pressing on him in apical region. IMPRESSIONS  1. Left ventricular There is mild left ventricular hypertrophy. Apical window is forshortened. The LVEF is approximately 50 to  55% with apical hypokinesis.  2. Right ventricular systolic function is normal. The right ventricular size is normal.  3. The mitral valve is normal in structure. Trivial mitral valve regurgitation.  4. The aortic valve is tricuspid. Aortic valve regurgitation is not visualized. Mild aortic valve sclerosis is present, with no evidence of aortic valve stenosis.  5. The inferior vena cava is normal in size with greater than 50% respiratory variability, suggesting right atrial pressure of 3 mmHg. FINDINGS  Left Ventricle: Left ventricular ejection fraction, by estimation, is 50 to 55%. The left ventricle has low normal function. The left ventricle demonstrates regional wall motion abnormalities. The left ventricular internal cavity size was normal in size. There is mild left ventricular hypertrophy. Left ventricular diastolic parameters are consistent with Grade I diastolic dysfunction (impaired relaxation). Right Ventricle: The right ventricular size is normal. Right vetricular  wall thickness was not assessed. Right ventricular systolic function is normal. Left Atrium: Left atrial size was normal in size. Right Atrium: Right atrial size was normal in size. Pericardium: There is no evidence of pericardial effusion. Mitral Valve: The mitral valve is normal in structure. Trivial mitral valve regurgitation. Tricuspid Valve: The tricuspid valve is normal in structure. Tricuspid valve regurgitation is trivial. Aortic Valve: The aortic valve is tricuspid. Aortic valve regurgitation is not visualized. Mild aortic valve sclerosis is present, with no evidence of aortic valve stenosis. Pulmonic Valve: The pulmonic valve was not well visualized. Pulmonic valve regurgitation is trivial. Aorta: The aortic root is normal in size and structure. Venous: The inferior vena cava is normal in size with greater than 50% respiratory variability, suggesting right atrial pressure of 3 mmHg. IAS/Shunts: No atrial level shunt detected by color flow Doppler.  LEFT VENTRICLE PLAX 2D LVIDd:         3.80 cm     Diastology LVIDs:         2.70 cm     LV e' lateral:   8.49 cm/s LV PW:         1.20 cm     LV E/e' lateral: 6.6 LV IVS:        1.10 cm     LV e' medial:    5.77 cm/s LVOT diam:     2.00 cm     LV E/e' medial:  9.7 LV SV:         62 LV SV Index:   38 LVOT Area:     3.14 cm  LV Volumes (MOD) LV vol d, MOD A2C: 67.0 ml LV vol d, MOD A4C: 71.4 ml LV vol s, MOD A2C: 28.6 ml LV vol s, MOD A4C: 38.2 ml LV SV MOD A2C:     38.4 ml LV SV MOD A4C:     71.4 ml LV SV MOD BP:      35.7 ml RIGHT VENTRICLE             IVC RV S prime:     18.20 cm/s  IVC diam: 1.50 cm TAPSE (M-mode): 2.3 cm LEFT ATRIUM             Index       RIGHT ATRIUM          Index LA diam:        3.50 cm 2.12 cm/m  RA Area:     7.89 cm LA Vol (A2C):   28.1 ml 17.04 ml/m RA Volume:   14.00 ml 8.49 ml/m LA Vol (A4C):   16.2 ml 9.83 ml/m  LA Biplane Vol: 22.3 ml 13.53 ml/m  AORTIC VALVE LVOT Vmax:   105.00 cm/s LVOT Vmean:  67.000 cm/s LVOT VTI:     0.197 m  AORTA Ao Root diam: 3.00 cm Ao Asc diam:  3.20 cm MITRAL VALVE MV Area (PHT): 4.15 cm    SHUNTS MV Decel Time: 183 msec    Systemic VTI:  0.20 m MV E velocity: 55.70 cm/s  Systemic Diam: 2.00 cm MV A velocity: 99.00 cm/s MV E/A ratio:  0.56 Dorris Carnes MD Electronically signed by Dorris Carnes MD Signature Date/Time: 08/02/2019/2:54:24 PM    Final        Assessment / Plan:    #80 69 year old African-American male admitted 2 days ago with complaints of chest pain and shortness of breath in setting of 2 to 46-monthhistory of abdominal pain, abdominal distention and inability to eat other than liquids. Unfortunately extensive evaluation has revealed widespread metastatic disease including prominent antral mucosa and adenopathy in the gastrohepatic ligament, large volume of ascites and severe atherosclerosis.  He has numerous pathologic fractures of the spine and diffusely abnormal bone marrow throughout suggesting of an infiltrative process.  Patient has been evaluated by Dr. Ennever/oncology, see his full note today he is felt to have bone marrow involvement contributing to his significant anemia and thrombocytopenia. He has cord involvement and has developed a flaccid paresis over the past week.  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 Dr. EMarin Olphas advised hospice evaluation.  Plan; Endoscopy for biopsy is not going to change his treatment, or offer any therapeutic benefit, and patient is very high risk for sedation given recent cardiac injury this admission. Continue clear to full liquids as tolerated Await cytology from paracentesis. GI will sign off, we are available if there is anything that we can do to help.   Principal Problem:   Symptomatic anemia Active Problems:   Hypertension   Ascites   Metastatic bone tumor (HCC)   [place]I (non-ST elevated myocardial infarction) [place]     LOS: 2 days   Hajra Port PA-C 6July 23, 2021 9:00 AM

## 2019-08-05 NOTE — Progress Notes (Signed)
Progress Note  Patient Name: Dylan Kaufman Date of Encounter: September 03, 2019  [facility] Cardiologist: Buford Dresser, MD New  Subjective   Dylan Kaufman looks much worse today on my interview. He cannot answer questions, moaning and tachypneic. Per bedside nurse, wife just met with Dr. Lannis Cocking. Awaiting family decision on next steps/goals of care.  Inpatient Medications    Scheduled Meds: . sodium chloride   Intravenous Once  . amLODipine  10 mg Oral Daily  . bisacodyl  10 mg Oral Once  . dexamethasone (DECADRON) injection  10 mg Intravenous Q12H  . feeding supplement  1 Container Oral TID BM  . polyethylene glycol  17 g Oral BID  . sodium chloride flush  3 mL Intravenous Q12H   Continuous Infusions: . pantoprazole (PROTONIX) IV 80 mg (09-03-19 1120)   PRN Meds: albuterol, bisacodyl, HYDROmorphone (DILAUDID) injection, ondansetron **OR** ondansetron (ZOFRAN) IV   Vital Signs    Vitals:   2019-09-03 0518 09-03-2019 0545 Sep 03, 2019 0940 09-03-2019 1229  BP: (!) 144/87  (!) 142/86 (!) 145/82  Pulse: (!) 107   (!) 116  Resp: 18   (!) 27  Temp: (!) 97.5 F (36.4 C)   97.9 F (36.6 C)  TempSrc: Oral   Oral  SpO2: 94%   90%  Weight:  53.5 kg    Height:        Intake/Output Summary (Last 24 hours) at September 03, 2019 1331 Last data filed at 09-03-19 0500 Gross per 24 hour  Intake 970 ml  Output 625 ml  Net 345 ml   Last 3 Weights Sep 03, 2019 08/03/2019 07/10/2019  Weight (lbs) 117 lb 14.4 oz 283 lb 11.2 oz 126 lb 15.8 oz  Weight (kg) 53.479 kg 128.685 kg 57.6 kg      Telemetry    Sinus rhythm/sinus tachycardia - Personally Reviewed  ECG    Sinus rhythm with ST depresions - Personally Reviewed  Physical Exam   GEN: Moaning, appears uncomfortable. Tachypneic, cachectic NECK: No JVD CARDIAC: regular rhythm, normal S1 and S2, no rubs or gallops. No murmur. VASCULAR: Radial and DP pulses 2+ bilaterally.  RESPIRATORY:  Clear to auscultation without rales, wheezing or  rhonchi anteriorly ABDOMEN: Soft, tender to palpation MUSCULOSKELETAL:  Moves all 4 limbs independently but not following commands SKIN: Warm and dry, no edema NEUROLOGIC:  Nonfocal PSYCHIATRIC:  Normal affect   Labs    High Sensitivity Troponin:   Recent Labs  Lab 08/02/19 0421 08/02/19 0801 08/02/19 1500 08/03/19 1100  TROPONINIHS 31* 261* 1,913* 833*      Chemistry Recent Labs  Lab 08/02/19 0242 08/03/19 1100  NA 139 145  K 3.3* 4.1  CL 100 111  CO2 23 23  GLUCOSE 150* 134*  BUN 36* 56*  CREATININE 0.95 0.82  CALCIUM 8.7* 8.2*  PROT 6.8 6.4*  ALBUMIN 3.1* 2.9*  AST 65* 63*  ALT 30 28  ALKPHOS 1,170* 918*  BILITOT 0.7 0.8  GFRNONAA >60 >60  GFRAA >60 >60  ANIONGAP 16* 11     Hematology Recent Labs  Lab 08/02/19 0242 08/02/19 1500 08/03/19 1100 08/03/19 1401 03-Sep-2019 0747  WBC 13.3*  --  10.5  --  6.1  RBC 2.06*  --  2.37*  --  2.53*  2.50*  HGB 5.9*   < > 6.9* 7.0* 7.5*  HCT 18.3*   < > 20.2* 21.5* 21.7*  MCV 88.8  --  85.2  --  85.8  MCH 28.6  --  29.1  --  29.6  MCHC  32.2  --  34.2  --  34.6  RDW 14.3  --  15.7*  --  15.5  PLT 74*  --  44*  --  37*   < > = values in this interval not displayed.    BNPNo results for input(s): BNP, PROBNP in the last 168 hours.   DDimer No results for input(s): DDIMER in the last 168 hours.   Radiology    MR BRAIN W WO CONTRAST  Result Date: 08/02/2019 CLINICAL DATA:  69 year old male with evidence of spinal metastatic disease on CT Chest, Abdomen, and Pelvis today. EXAM: MRI HEAD WITHOUT AND WITH CONTRAST TECHNIQUE: Multiplanar, multiecho pulse sequences of the brain and surrounding structures were obtained without and with intravenous contrast. CONTRAST:  52m GADAVIST GADOBUTROL 1 MMOL/ML IV SOLN COMPARISON:  No prior brain imaging. FINDINGS: Brain: There is subtle abnormal diffusion in white matter near the left splenium of the corpus callosum (series 7 image 42). Mild T2 and FLAIR hyperintensity is  associated, with scattered additional patchy white matter T2 and FLAIR hyperintensity elsewhere. Similar T2 and FLAIR hyperintensity in the left basal ganglia compatible with chronic lacunar infarct. No other abnormal diffusion. No cortical encephalomalacia identified. There is a chronic microhemorrhage in the left thalamus. Questionable similar chronic microhemorrhage in the right deep cerebellar nuclei. Motion degraded postcontrast imaging throughout the brain. No definite abnormal parenchymal enhancement. No midline shift, mass effect, ventriculomegaly, extra-axial collection or acute intracranial hemorrhage. Cervicomedullary junction and pituitary are within normal limits. Vascular: Major intracranial vascular flow voids are preserved. Postcontrast images are motion degraded but the superior sagittal sinus, transverse and sigmoid sinuses appear to be grossly patent and enhancing. Skull and upper cervical spine: Abnormal marrow signal in the occipital condyles and throughout the visible cervical spine. Superimposed discrete and expansile bone mass at the left lower temporal bone (series 9, image 18, series 5, image 52. Associated mild left mastoid effusion. No other destructive skull lesion identified. Sinuses/Orbits: Negative. Other: Right mastoids remain clear.  No scalp soft tissue lesion. IMPRESSION: 1. Positive for bone metastases to the skull base and visible cervical spine, including a mildly expansile lesion of the left temporal bone. Mild associated left mastoid effusion. 2. Substantially degraded postcontrast images of the brain with no obvious brain metastasis. A repeat staging Brain MRI when the patient can better cooperate would be valuable. 3. Evidence of small vessel disease with possible recent white matter ischemia near the left splenium. Electronically Signed   By: HGenevie AnnM.D.   On: 08/02/2019 14:22   MR THORACIC SPINE W WO CONTRAST  Result Date: 08/02/2019 CLINICAL DATA:  Back pain.  Abnormal spine on CT. Chronic hepatitis C infection. EXAM: MRI THORACIC WITHOUT AND WITH CONTRAST TECHNIQUE: Multiplanar and multiecho pulse sequences of the thoracic spine were obtained without and with intravenous contrast. CONTRAST:  665mGADAVIST GADOBUTROL 1 MMOL/ML IV SOLN COMPARISON:  CT chest abdomen pelvis earlier today. FINDINGS: MRI THORACIC SPINE FINDINGS Alignment:  Normal Vertebrae: Diffusely abnormal bone marrow throughout the thoracic spine with diffuse low signal intensity suggesting a diffuse infiltrative process. Scattered sclerotic lesions are present at several levels on CT. Mild compression fracture of T5 with bone marrow edema. Mild compression fractures of T7, T8, T9, T10 with bone marrow edema. Mild compression fracture T12 with mild bone marrow edema. There is heterogeneous enhancement of the bone marrow involving these fractures. Cord: There is spinal stenosis due to epidural soft tissue thickening from T6 through T10. This is causing cord flattening and  mild cord compression. No cord signal abnormality is identified. Paraspinal and other soft tissues: Epidural soft tissue thickening and enhancement from T6 through T10. There appears to be infiltration of the posterior epidural fat with enhancement. This is predominately posterior but appears circumferential. This is contributing to moderate spinal stenosis Disc levels: Negative for disc protrusion.  Negative for disc space infection. IMPRESSION: Diffusely abnormal bone marrow throughout the thoracic spine suggesting an infiltrative process such as lymphoma, metastatic disease, or multiple myeloma. Multiple pathologic fractures that appear recent involving T5, T7, T9, T10, T12 Extensive epidural thickening and enhancement in the midthoracic spine causing compression of the spinal cord. 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 These results were called by telephone at the time of interpretation on 08/02/2019 at 2:48 pm to provider Matcha, who verbally  acknowledged these results. Electronically Signed   By: Franchot Gallo M.D.   On: 08/02/2019 14:51   MR Lumbar Spine W Wo Contrast  Result Date: 08/02/2019 CLINICAL DATA:  Back pain.  Abnormal CT of the spine. EXAM: MRI LUMBAR SPINE WITHOUT AND WITH CONTRAST TECHNIQUE: Multiplanar and multiecho pulse sequences of the lumbar spine were obtained without and with intravenous contrast. CONTRAST:  23m GADAVIST GADOBUTROL 1 MMOL/ML IV SOLN COMPARISON:  CT  chest abdomen pelvis 08/02/2019 FINDINGS: Segmentation:  Normal Alignment:  Normal Vertebrae: Diffusely abnormal bone marrow which is markedly low signal on T1. Mild superior and inferior endplate fractures are present throughout the lumbar spine. There is mild edema involving the T12 and L2 fracture which may be recent. No focal mass lesion is identified. Conus medullaris and cauda equina: Conus extends to the L1-2 level. Conus and cauda equina appear normal. Paraspinal and other soft tissues: Moderate ascites. Liver and spleen are diffusely low signal. Patient has a history of chronic hepatitis C infection. No paraspinous adenopathy. Disc levels: L1-2: Negative L2-3: Negative L3-4: Small central disc protrusion.  Negative for stenosis L4-5: Small central disc protrusion.  Negative for stenosis L5-S1: Negative IMPRESSION: Diffusely abnormal bone marrow. The patient has severe anemia. No focal lesion identified. Possible lymphoma or myeloproliferative disorder. Also consider widespread metastatic disease or myeloma. Consider bone marrow biopsy. Multiple mild compression fractures throughout the lumbar spine. There is mild edema at T12 and L2 suggesting recent fractures. These are mild. Moderate ascites. Diffusely low signal in the liver and spleen which may reflect hemochromatosis. Electronically Signed   By: CFranchot GalloM.D.   On: 08/02/2019 14:25   [place]  Result Date: 08/03/2019 INDICATION: Patient with history of hepatitis-C, numerous sclerotic  foci in the spine, prominent gastric antral mucosa and adenopathy, ascites. Request made for diagnostic and therapeutic paracentesis. EXAM: ULTRASOUND GUIDED DIAGNOSTIC AND THERAPEUTIC PARACENTESIS MEDICATIONS: None COMPLICATIONS: None immediate. PROCEDURE: Informed written consent was obtained from the patient after a discussion of the risks, benefits and alternatives to treatment. A timeout was performed prior to the initiation of the procedure. Initial ultrasound scanning demonstrates a moderate amount of ascites within the left lower abdominal quadrant. The left lower abdomen was prepped and draped in the usual sterile fashion. 1% lidocaine was used for local anesthesia. Following this, a 6 Fr Safe-T-Centesis catheter was introduced. An ultrasound image was saved for documentation purposes. The paracentesis was performed. The catheter was removed and a dressing was applied. The patient tolerated the procedure well without immediate post procedural complication. FINDINGS: A total of approximately 2.4 liters of hazy, amber fluid was removed. Samples were sent to the laboratory as requested by the clinical team. IMPRESSION: Successful ultrasound-guided diagnostic  and therapeutic paracentesis yielding 2.4 liters of peritoneal fluid. Read by: Rowe Robert, PA-C Electronically Signed   By: Jerilynn Mages.  Shick M.D.   On: 08/03/2019 16:02    Cardiac Studies   Echocardiogram 08/02/19: 1. Left ventricular There is mild left ventricular hypertrophy. Apical  window is forshortened. The LVEF is approximately 50 to 55% with apical  hypokinesis.  2. Right ventricular systolic function is normal. The right ventricular  size is normal.  3. The mitral valve is normal in structure. Trivial mitral valve  regurgitation.  4. The aortic valve is tricuspid. Aortic valve regurgitation is not  visualized. Mild aortic valve sclerosis is present, with no evidence of  aortic valve stenosis.  5. The inferior vena cava is normal in  size with greater than 50%  respiratory variability, suggesting right atrial pressure of 3 mmHg.   Patient Profile     69 y.o. male with a hx of hepatitis C infection s/p Harvoni, PAD on pletal, HTN, and tobacco abuse,who presented to the ED with SOB, chest pain, back pain, abdominal pain, and urinary incontinence. Imaging this admission is concerning for metastatic cancer possibly 2/2 gastric primary with large volume ascites and lesions in the spine and scull base. Cardiology is following for the evaluation of elevated troponin and abnormal EKG.  Assessment & Plan    Abnormal ECG, elevated troponins with known coronary calcium, aortic atherosclerosis, and PAD Metastatic cancer of unknown primary: He looks much worse today. He is not conversant, breathing shallowly, and moaning. Not following commands. Per bedside nurse wife just met with Dr. Kevis Cocking and is discussing goals of care. Appreciate honest, clear assessment and prognosis from Dr. Marin Olp as well.  This is a very unfortunate situation, and he appears to be decompensating rapidly. We do not have anything to offer from a cardiovascular standpoint. With his rapid decline, utility of statin/beta blocker likely without significant benefit.  We will sign off at this time but are available for any additional questions.  [facility] will sign off.   Medication Recommendations:  none Other recommendations (labs, testing, etc):  none Follow up as an outpatient:  None anticipated, but please contact us if the clinical scenario changes significantly.  For questions or updates, please contact [place] Please consult [email or web address] for contact info under        Signed, Buford Dresser, MD  August 26, 2019, 1:31 PM

## 2019-08-05 NOTE — Progress Notes (Signed)
Unfortunately, [name] does NOT have prostate cancer.  His PSA is only 3.8.  However, the CEA is about 400.  I have to believe that he is going to end up having widely metastatic gastric cancer.  Gastric cancer clearly is the one GI malignancy that does like to go to the bones.  He had ascites taken off yesterday.  He had 2.4 L of fluid taken off.  The cytology is pending.  I really do not think that he is fully cognizant has to what is the problem and how bad the problem is.  I tried to talk to talk to him this morning about this and he just did not want to listen.  He just kept telling me that "my body needs rest."  I try to give his wife a call on the cell phone.  I left a little bit of a message but did not really go into detail as to what the future holds.  I think the real problem that we have is that he has clear involvement of his bone marrow.  One look at his blood smear will tell you this.  His platelet count is only 44,000.  His hemoglobin is 7.  There is absolutely NO way for [place] to treat this.  It is in no shape for treatment.  His performance status is quite poor (ECOG 3-4).  I am truly doubt that he will survive more than a month.  He has the cord compression.  He is not a candidate for any radiation therapy for this as it will not improve his neurological function.  I really believe that he would be best served by getting Hospice involved.  Whether or not he can go home is unclear.  He certainly would be a candidate for [employer].  Again, [name] just is not willing right now to listen to the reality of what he is dealing with.  I will try to speak to his wife about all of this.  We just do not have anything to offer him at this point.  He is just too advanced.  He has a poor performance status.  He has very little, if any, marrow reserve because of extensive marrow involvement by this malignancy.  I just feel bad for [name].  This is truly the "dark side" of what cancer can  do.  He clearly has had this for a while.  I just hate that he is a full code.  Keep him alive on machines will not benefit him.  I think if he were to go on life support, he would never come off because his body is so weak.  I will give him a dose of Zometa.  He probably needs little bit of iron also.  We will just try to palliate symptoms and maybe this can give him a little bit of quality of life.  I know that the staff on [address] are trying to do a good job with him.  I am sure this is incredibly challenging for them.  I appreciate their efforts in their compassion.  Lattie Haw, MD  Psalm 23:4

## 2019-08-05 NOTE — Progress Notes (Signed)
MD in the room to reassess Pt/ Pt's heart rate sustaining 140-160s. Resp pattern is labored Pt remains on NRB Mask with O2 sats 88-90%. MD has discussed Pt's declining status with Pt's Wife and Pt is now Comfort Care. Emotional support given to Pt and Pt's Wife.

## 2019-08-05 NOTE — Progress Notes (Signed)
Pt declining over last hour. RR 30s-40s, 02 stat 80% on 4L, HR 130s. MD Sloan Leiter and MD Saifullah Cocking notified. Awaiting orders.

## 2019-08-05 NOTE — Progress Notes (Signed)
Pt passed on 07/25/19 at 22:40 Post mortem documentation completed and wife is aware.

## 2019-08-05 NOTE — Plan of Care (Signed)
  Problem: Education: Goal: Knowledge of General Education information will improve Description Including pain rating scale, medication(s)/side effects and non-pharmacologic comfort measures Outcome: Progressing   

## 2019-08-05 NOTE — Progress Notes (Signed)
   Aug 18, 2019 1429  Assess: MEWS Score  Temp 98.2 F (36.8 C)  BP 135/76  Pulse Rate (!) 131  Resp (!) 30  Level of Consciousness Responds to Pain (turning related to incontinence)  SpO2 (!) 82 %  O2 Device Nasal Cannula  Patient Activity (if Appropriate) In bed  O2 Flow Rate (L/min) 6 L/min  Assess: MEWS Score  MEWS Temp 0  MEWS Systolic 0  MEWS Pulse 3  MEWS RR 2  MEWS LOC 2  MEWS Score 7  MEWS Score Color Red  Assess: if the MEWS score is Yellow or Red  Were vital signs taken at a resting state? Yes  Focused Assessment Documented focused assessment  Early Detection of Sepsis Score *See Row Information* Low  MEWS guidelines implemented *See Row Information* Yes  Notify: Provider  Provider Name/Title Dr. Sloan Leiter   Date Provider Notified 08-18-19  Time Provider Notified 1441  Notification Type Page  Notification Reason Change in status;Other (Comment) (HR change increased heart rate and resp rate)  Response Other (Comment) (Palative Care in to ralk to Pt's Wife)  Date of Provider Response 08-18-2019  Time of Provider Response 1443

## 2019-08-05 NOTE — Progress Notes (Signed)
Initial Nutrition Assessment  DOCUMENTATION CODES:   Not applicable  INTERVENTION:  Boost Breeze po TID, each supplement provides 250 kcal and 9 grams of protein   NUTRITION DIAGNOSIS:   Predicted suboptimal nutrient intake related to decreased appetite as evidenced by other (comment) (per [facility] report).    GOAL:   Patient will meet greater than or equal to 90% of their needs    MONITOR:   PO intake, Supplement acceptance, Labs, I & O's, Weight trends, Diet advancement  REASON FOR ASSESSMENT:   Malnutrition Screening Tool    ASSESSMENT:   Pt with a PMH significant for HTN, hepatitis C, ascites. Pt presented with dyspnea, abdominal pain and chest pain with evidence of severe anemia and an NSTEMI. Imaging was obtained and was significant for likely metastatic cancer to bone of unknown primary.  6/29 s/p paracentesis, 2.4L removed, cytology pending  Pt unavailable at time of visit.   Per Oncology, pt likely has gastric cancer and prognosis is grim. Pt not a good candidate for treatment given progression of disease. Per MD, pt not yet grasping severity of his illness and is repeating "my body just needs rest." Palliative Care consult pending.   Per wt readings, pt with potential 7.15% wt loss x2 weeks, which would be significant for time frame. Unable to verify accuracy of wt readings at this time.   Suspect pt is malnourished; however, unable to diagnose at this time without nutrition focused physical exam and/or diet history.   UOP: 1,065ml x24 hours I/O: +3,356.11ml since admit  Labs reviewed. Medications: Dulcolax, Decadron, Ensure Enlive TID (will d/c as it is not a clear liquid), Miralax, Protonix  NUTRITION - FOCUSED PHYSICAL EXAM:  Unable to perform at this time. Will attempt at follow-up.  Diet Order:   Diet Order            Diet clear liquid Room service appropriate? Yes; Fluid consistency: Thin  Diet effective now                 EDUCATION NEEDS:    No education needs have been identified at this time  Skin:  Skin Assessment: Reviewed RN Assessment  Last BM:  6/29  Height:   Ht Readings from Last 1 Encounters:  07/23/2019 5\' 6"  (1.676 m)    Weight:   Wt Readings from Last 3 Encounters:  Aug 08, 2019 53.5 kg  07/16/19 57.6 kg  06/15/19 54.9 kg    BMI:  Body mass index is 19.03 kg/m.  Estimated Nutritional Needs:   Kcal:  1850-2050  Protein:  90-105 grams  Fluid:  >1.8L/d    Larkin Ina, MS, RD, LDN RD pager number and weekend/on-call pager number located in [place].

## 2019-08-05 NOTE — Plan of Care (Signed)
  Problem: Education: Goal: Knowledge of General Education information will improve Description: Including pain rating scale, medication(s)/side effects and non-pharmacologic comfort measures 2019-08-14 2050 by Nelia Shi, RN Outcome: Progressing Aug 14, 2019 2050 by Nelia Shi, RN Outcome: Progressing

## 2019-08-05 NOTE — Consult Note (Signed)
Consultation Note Date: Aug 07, 2019   Patient Name: Dylan Kaufman  DOB: 1950/04/11  MRN: [id number]  Age / Sex: 69 y.o., male  PCP: Venia Carbon, MD Referring Physician: Barb Merino, MD  Reason for Consultation: Establishing goals of care  HPI/Patient Profile: 69 y.o. 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  Patient found to have cord compression with accompanying paraplegia.   Neurosurgery does not recommend surgery. Oncology believes his cancer is too advanced and is not treatable at its current stage in conjunction with his poor functional status. Radiation Oncology does not recommend urgent radiotherapy, but could consider palliative radiotherapy depending on his clinical course.   Cardiology recommended medical management as he is not a candidate for cardiac catheterization at this time.   GI recommended upper endoscopy. The patient was schedule for procedure twice, and both times procedure was postponed due to cardiac concerns.   Paracentesis performed 6/29.   Patient has received 3 pRBC and 1 FFP transfusion since admission on 07/31/2019.  Clinical Assessment and Goals of Care: I have reviewed medical records including EPIC notes, labs and imaging, received report from primary RN, assessed the patient and then met with Cynthia/wife and patient to discuss diagnosis, prognosis, GOC, EOL wishes, disposition, and options in conjunction with Elmer Picker, NP from palliative care team.  [name] to visit patient at bedside - he was lying in bed asleep, but did awake to gentle touch and voice. He was very lethargic during the duration of the visit, moaning at times, and tachypneic - unsure how much of the conversation he was able to  follow. The majority of the conversation was had with Caren Griffins.   I introduced Palliative Medicine as specialized medical care for people living with serious illness. It focuses on providing relief from the symptoms and stress of a serious illness. The goal is to improve quality of life for both the patient and the family.  We discussed a brief life review of the patient. [name] was described by [place] as someone who enjoyed ice cream. 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 [name] had moved from [place] to [facility] in February 2021 to be with his wife. He has two daughters from a previous marriage that live in [place] states that the patient and his daughters are not in touch with each other often. He has a cousin/Mike whom he is close with that also lives in [place]. He has no other family that lives in the area other than his wife. He is of [employer].   As far as functional status, Caren Griffins stated that the patient was having back pain for the past several months, but last Thursday (07/29/19) is when she noticed a drastic decline. It was at that time he lost control of his bowel and bladder and was not able to get up out of the bed.   We discussed patient's current illness and what it means in the larger context of patient's on-going co-morbidities. Natural disease trajectory and expectations at EOL were discussed. I attempted to elicit values and goals of care important to the patient and family and the difference between aggressive medical intervention and comfort care was considered in light of the patient's goals of care. Caren Griffins was open to all conversation and asked appropriate  questions regarding the patient and his condition. She wanted to receive results from the paracentesis before deciding on what the next steps should be.   Advance directives, concepts specific to code status, artificial feeding and hydration, and rehospitalization were considered and discussed. Caren Griffins  stated that [name] wanted to be cremated upon his death.   Discussed with patient/family the importance of continued conversation with the medical providers regarding overall plan of care and treatment options, ensuring decisions are within the context of the patient's values and GOCs.    Caren Griffins seemed appreciative of PMT visit.   After meeting was held, PMT was approached by attending and primary RN - both requested to review the code status again with wife due to concerns the patient was declining quickly. Another meeting was held with Caren Griffins that included PMT, Dr. Sloan Leiter, and Caren Griffins. DNR/DNI was recommended and Caren Griffins was agreeable. She stated that she did not want [name] to suffer.  PMT later was paged by primary RN that patient was further declining - [name] had increased work of breathing with increasing O2 sat requirements. Also noted patient to have increased moaning and did not awake to voice. PMT met with Caren Griffins in person and Mike/patient's cousin via phone to continue [place] discussion in light of patient's rapid decline.  Discussed concern that he may be having further cardiac involvement. Caren Griffins and Ronalee Belts were in agreement that we will continue current medical treatment with gentle medical escalation if needed; however, she agrees with plan not to pursue ICU level care or intubation. She was ok with non-rebreather mask use, but did not want increased interventions beyond that. Caren Griffins and Ronalee Belts were both in understanding that [name] was likely nearing EOL. She requested to be able to spend the night at the hospital with her husband, which is a reasonable request.  Discussed consideration for comfort focus if he further declines.  Questions and concerns were addressed. The family was encouraged to call with questions or concerns.   Primary Decision Maker NEXT OF KIN Jonell Cluck - wife  SUMMARY OF RECOMMENDATIONS   -Continue current medical treatment. Utilize gentle medical  escalation if needed, which includes non-rebreather, but no escalation of interventions beyond that.  -DNR/DNI initiated - DNR form completed and placed in shadow chart -Unrestricted visitor orders placed as patient is nearing EOL -Utilize dilaudid for dyspnea and pain as needed for EOL comfort -PMT will follow holistically  Code Status/Advance Care Planning:  DNR   Palliative Prophylaxis:   Aspiration, Bowel Regimen, Delirium Protocol, Frequent Pain Assessment, Oral Care and Turn Reposition  Additional Recommendations (Limitations, Scope, Preferences):  Continue current interventions with gentle medical escalation if needed - nothing beyond non-rebreather  Psycho-social/Spiritual:   Created space and opportunity for patient and family to express thoughts and feelings regarding patient's current medical situation.  Emotional support provided.   Prognosis:   Hours - Days  Discharge Planning: Anticipated Hospital Death      Primary Diagnoses: Present on Admission: . Metastatic bone tumor ([place]) . Hypertension . Ascites   I have reviewed the medical record, interviewed the patient and family, and examined the patient. The following aspects are pertinent.  Past Medical History:  Diagnosis Date  . Hepatitis C infection    treated with harvoni  . Hypertension   . PAD (peripheral artery disease) (HCC)    Social History   Socioeconomic History  . Marital status: Married    Spouse name: Not on file  . Number of children: 3  .  Years of education: Not on file  . Highest education level: Not on file  Occupational History  . 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 Smoking status: Current Every Day Smoker    Start date: 31  . Smokeless tobacco: Never Used  Substance and Sexual Activity  . Alcohol use: Yes    Comment: occ beer  . Drug use: Not on file  . Sexual activity: Not on file  Other Topics Concern  . Not on  file  Social History Narrative  . Not on file   Social Determinants of Health   Financial Resource Strain:   . Difficulty of Paying Living Expenses:   Food Insecurity:   . Worried About Charity fundraiser in the Last Year:   . Arboriculturist in the Last Year:   Transportation Needs:   . Film/video editor (Medical):   Marland Kitchen Lack of Transportation (Non-Medical):   Physical Activity:   . Days of Exercise per Week:   . Minutes of Exercise per Session:   Stress:   . Feeling of Stress :   Social Connections:   . Frequency of Communication with Friends and Family:   . Frequency of Social Gatherings with Friends and Family:   . Attends Religious Services:   . Active Member of Clubs or Organizations:   . Attends Archivist Meetings:   Marland Kitchen Marital Status:    Family History  Problem Relation Age of Onset  . HIV/AIDS Brother    Scheduled Meds: . sodium chloride   Intravenous Once  . amLODipine  10 mg Oral Daily  . bisacodyl  10 mg Oral Once  . dexamethasone (DECADRON) injection  10 mg Intravenous Q12H  . feeding supplement  1 Container Oral TID BM  . polyethylene glycol  17 g Oral BID  . sodium chloride flush  3 mL Intravenous Q12H   Continuous Infusions: . pantoprazole (PROTONIX) IV 80 mg (August 06, 2019 1120)   PRN Meds:.albuterol, bisacodyl, HYDROmorphone (DILAUDID) injection, ondansetron **OR** ondansetron (ZOFRAN) IV No Known Allergies Review of Systems  Unable to perform ROS: Acuity of condition    Physical Exam Vitals and nursing note reviewed.  Constitutional:      Appearance: He is ill-appearing.  Pulmonary:     Effort: Tachypnea present.     Comments: Increased work of breathing Neurological:     Mental Status: He is lethargic.     Vital Signs: BP 135/81 (BP Location: Left Arm)   Pulse (!) 138   Temp 98 F (36.7 C) (Oral)   Resp (!) 27   Ht 5' 6"  (1.676 m)   Wt 53.5 kg   SpO2 (!) 89%   BMI 19.03 kg/m  Pain Scale: PAINAD   Pain Score:  Asleep   SpO2: SpO2: (!) 89 % O2 Device:SpO2: (!) 89 % O2 Flow Rate: .O2 Flow Rate (L/min): 15 L/min  IO: Intake/output summary:   Intake/Output Summary (Last 24 hours) at 06-Aug-2019 1601 Last data filed at 2019-08-06 0500 Gross per 24 hour  Intake 630 ml  Output 625 ml  Net 5 ml    LBM: Last BM Date: 08/03/19 Baseline Weight: Weight: 57.6 kg Most recent weight: Weight: 53.5 kg     Palliative Assessment/Data: PPS 20%   Discussed case with Cynthia/wife, Mike/cousin, Dr. Sloan Leiter, primary RN, charge RN, Case Management  Time: 1030-1200, 7564-3329 Time Total: 115 minutes Greater than 50%  of this time was spent counseling and coordinating care  related to the above assessment and plan.  Micheline Rough, MD [facility] [phone]

## 2019-08-05 NOTE — Progress Notes (Signed)
Dr Tarri Glenn to bedside, procedure cancelled at this time r/t cardiac issues, pt to return to floor, bedside rn called, report given

## 2019-08-05 NOTE — Progress Notes (Signed)
Dr. Gerold Cocking in to speak with Pt's wife regarding goals of care. Pt placed on NRB mask with 02 saturations improving up to 90%. Reassurance given to Pt and Pt's Wife. Maintain current plan of care for the Pt and monitor closley

## 2019-08-05 NOTE — TOC Progression Note (Signed)
Transition of Care [facility]) - Progression Note    Patient Details  Name: Xiong Haidar MRN: [id number] Date of Birth: 1950/07/07  [facility]) CM/SW Contact  Wendelin Bradt, Juliann Pulse, RN Phone Number: 08/18/2019, 3:00 PM  Clinical Narrative: Currently DNR, NRB.Spoke to spouse in rm,offered support.       Expected Discharge Plan:  [facility] death) Barriers to Discharge: Continued Medical Work up  Expected Discharge Plan and Services Expected Discharge Plan:  [facility] death)                                               Social Determinants of Health (SDOH) Interventions    Readmission Risk Interventions No flowsheet data found.

## 2019-08-05 NOTE — Progress Notes (Signed)
PROGRESS NOTE    Dylan Kaufman  ZRA:[id number] DOB: [birth year] DOA: 07/07/2019 PCP: Venia Carbon, MD    Brief Narrative:  69 year old gentleman with history of hypertension, hepatitis C status post Harvoni presented to the emergency room with several weeks of multiple complaints including shortness of breath, abdominal pain and distention, chest pain, right back pain.  In the emergency room, he had flaccid paralysis of the legs, urinary retention and constipation. Abdominal scan was consistent with multiple sclerotic bony metastasis, abnormal stomach lesions with ascites, severe symptomatic anemia. Presentation hemoglobin was 5.  CTA negative for PE.  CT abdomen pelvis numerous sclerotic foci in the spine.  MRI of the brain, MRI of the thoracic and lumbar spine with diffusely abnormal bone marrow throughout the thoracic spine, multiple pathological fractures and extensive epidural thickening and enhancement in the mid thoracic spine causing cord compression. Troponins were also elevated. Neurosurgery and oncology consulted.  Radiation oncology consulted and patient was transferred to [facility]. Currently remains on very poor situation and actively dying.   Assessment & Plan:   Principal Problem:   Symptomatic anemia Active Problems:   Hypertension   Ascites   Metastatic bone tumor (HCC)   NSTEMI (non-ST elevated myocardial infarction) (HCC)  Symptomatic anemia/blood loss anemia acute on chronic, suspect primary GI malignancy: History of melanotic stool.  Negative FOBT on arrival. Total 3 units of PRBC received, GI attempted upper GI endoscopy, however unable to do because of cardiac events including non-STEMI and unstable patient.  Symptomatic treatment.  Non-STEMI: Diffuse ST depression on EKG with elevated troponin of 1913.  Cardiology consulted.  Echocardiogram showed mildly reduced ejection fraction with apical hypokinesis.  Patient with terminal illness, cardiology  signed off.  Diffusely metastatic cancer: Metastasis to bone in the spine and skull.  Thoracic cord compression with flaccid paralysis.  PSA normal.  Negative for prostate cancer.  Abnormal gastric mucosa suspected primary malignancy from GI tract. Unable to undergo endoscopy evaluation. Paracentesis was done, cytology is pending. Patient with spinal cord compression and flaccid paraplegia. Neurosurgery consulted recommended radiation to the spine, radiation oncology saw the patient and decided that patient will not benefit with radiation treatment with irreversible injury. Oncology consulted, they recommended hospice and no possible treatment. Patient with severe pain, nausea, arrhythmias and altered mentation. Multiple discussions by myself and palliative care physician with patient's wife, patient occasionally able to participate, he is distressed and angry.  We recommended hospice. After much discussion, changed to DNR and DNI. Wife wants to continue supportive care, only other family member is patient's cousin who may be able to come from [place]. Providing all supportive care and counseling to the patient's wife as patient is actively dying. If becomes unstable, will start on comfort care measures.  3 PM Multiple meetings with patient's wife, she understands that patient is dying.  Appreciate palliative care team's approach. Provide symptomatic treatment, oxygen and nonrebreather if needed to see if other family members can come and see him.   DVT prophylaxis: SCDs Start: 08/02/19 5456   Code Status: DNR/DNI Family Communication: Wife at the bedside Disposition Plan: Status is: Inpatient  Remains inpatient appropriate because:Hemodynamically unstable, Altered mental status and Inpatient level of care appropriate due to severity of illness   Dispo: The patient is from: Home              Anticipated d/c is to: Unknown.  Hospital death anticipated.              Anticipated  d/c  date is: 2 days              Patient currently is not medically stable to d/c.         Consultants:   Oncology  Radiation oncology  Neurosurgery  Palliative medicine  Procedures:   Paracentesis, cytology pending  Antimicrobials:   None   Subjective: Patient seen and examined.  Examined multiple times. Early morning, patient was awake and was able to keep up some conversation.  He asked me whether he is dying and I said yes.  Patient was distressed and agitated. By evening, patient is more somnolent and lethargic and unable to keep up conversation, swallow and rapid breathing.  Objective: Vitals:   August 29, 2019 1229 29-Aug-2019 1348 2019-08-29 1429 Aug 29, 2019 1502  BP: (!) 145/82 133/80 135/76 (!) 143/77  Pulse: (!) 116 (!) 124 (!) 131 (!) 136  Resp: (!) 27 (!) 29 (!) 30   Temp: 97.9 F (36.6 C) 98 F (36.7 C) 98.2 F (36.8 C)   TempSrc: Oral Oral Oral   SpO2: 90% (!) 80% (!) 82% 90%  Weight:      Height:        Intake/Output Summary (Last 24 hours) at 29-Aug-2019 1508 Last data filed at 2019/08/29 0500 Gross per 24 hour  Intake 630 ml  Output 625 ml  Net 5 ml   Filed Weights   07/12/2019 2124 08/03/19 0703 Aug 29, 2019 0545  Weight: 57.6 kg 128.7 kg 53.5 kg    Examination:  Physical Exam Constitutional:      Comments: Sick looking, in moderate distress, anxious and agitated.  Angry on conversation in the morning, currently not able to have conversation.  HENT:     Head: Normocephalic.  Cardiovascular:     Heart sounds: No murmur heard.      Comments: Tachycardic. Pulmonary:     Comments: Mostly conducted airway sounds. Abdominal:     Comments: Tense, bloated, bowel sounds present.       Data Reviewed: I have personally reviewed following labs and imaging studies  CBC: Recent Labs  Lab 08/02/19 0242 08/02/19 1500 08/03/19 1100 08/03/19 1401 08/29/2019 0747  WBC 13.3*  --  10.5  --  6.1  HGB 5.9* 8.5* 6.9* 7.0* 7.5*  HCT 18.3* 26.2* 20.2* 21.5*  21.7*  MCV 88.8  --  85.2  --  85.8  PLT 74*  --  44*  --  37*   Basic Metabolic Panel: Recent Labs  Lab 08/02/19 0242 08/03/19 1100  NA 139 145  K 3.3* 4.1  CL 100 111  CO2 23 23  GLUCOSE 150* 134*  BUN 36* 56*  CREATININE 0.95 0.82  CALCIUM 8.7* 8.2*   GFR: Estimated Creatinine Clearance: 64.3 mL/min (by C-G formula based on SCr of 0.82 mg/dL). Liver Function Tests: Recent Labs  Lab 08/02/19 0242 08/03/19 1100  AST 65* 63*  ALT 30 28  ALKPHOS 1,170* 918*  BILITOT 0.7 0.8  PROT 6.8 6.4*  ALBUMIN 3.1* 2.9*   Recent Labs  Lab 08/02/19 0242  LIPASE 31   No results for input(s): AMMONIA in the last 168 hours. Coagulation Profile: Recent Labs  Lab 08/02/19 0421  INR 1.7*   Cardiac Enzymes: No results for input(s): CKTOTAL, CKMB, CKMBINDEX, TROPONINI in the last 168 hours. BNP (last 3 results) No results for input(s): PROBNP in the last 8760 hours. HbA1C: No results for input(s): HGBA1C in the last 72 hours. CBG: Recent Labs  Lab 08/02/19 0756 08/02/19 1650 August 29, 2019 0726  GLUCAP 126* 125* 136*   Lipid Profile: No results for input(s): CHOL, HDL, LDLCALC, TRIG, CHOLHDL, LDLDIRECT in the last 72 hours. Thyroid Function Tests: No results for input(s): TSH, T4TOTAL, FREET4, T3FREE, THYROIDAB in the last 72 hours. Anemia Panel: Recent Labs    08/03/19 1055 08/03/19 1056 14-Aug-2019 0747  VITAMINB12  --  1,220*  --   FOLATE 9.8  --   --   FERRITIN  --  22*  --   TIBC  --  376  --   IRON  --  119  --   RETICCTPCT  --   --  1.1   Sepsis Labs: Recent Labs  Lab 08/02/19 0421 08/02/19 0815  LATICACIDVEN 2.0* 1.2    Recent Results (from the past 240 hour(s))  SARS Coronavirus 2 by RT PCR (hospital order, performed in [facility] hospital lab) Nasopharyngeal Nasopharyngeal Swab     Status: None   Collection Time: 08/02/19  5:52 AM   Specimen: Nasopharyngeal Swab  Result Value Ref Range Status   SARS Coronavirus 2 NEGATIVE NEGATIVE Final    Comment:  (NOTE) SARS-CoV-2 target nucleic acids are NOT DETECTED.  The SARS-CoV-2 RNA is generally detectable in upper and lower respiratory specimens during the acute phase of infection. The lowest concentration of SARS-CoV-2 viral copies this assay can detect is 250 copies / mL. A negative result does not preclude SARS-CoV-2 infection and should not be used as the sole basis for treatment or other patient management decisions.  A negative result may occur with improper specimen collection / handling, submission of specimen other than nasopharyngeal swab, presence of viral mutation(s) within the areas targeted by this assay, and inadequate number of viral copies (<250 copies / mL). A negative result must be combined with clinical observations, patient history, and epidemiological information.  Fact Sheet for Patients:   StrictlyIdeas.no  Fact Sheet for Healthcare Providers: BankingDealers.co.za  This test is not yet approved or  cleared by the [place] FDA and has been authorized for detection and/or diagnosis of SARS-CoV-2 by FDA under an Emergency Use Authorization (EUA).  This EUA will remain in effect (meaning this test can be used) for the duration of the COVID-19 declaration under Section 564(b)(1) of the Act, 21 U.S.C. section 360bbb-3(b)(1), unless the authorization is terminated or revoked sooner.  Performed at [facility], [place] [address], [place], [place] [address]   Culture, body fluid-bottle     Status: None (Preliminary result)   Collection Time: 08/03/19  4:20 PM   Specimen: Fluid  Result Value Ref Range Status   Specimen Description FLUID PERITONEAL  Final   Special Requests   Final    BOTTLES DRAWN AEROBIC AND ANAEROBIC Blood Culture results may not be optimal due to an excessive volume of blood received in culture bottles   Culture   Final    NO GROWTH < 12 HOURS Performed at [facility], [place]  [address], [place], [place] [address]    Report Status PENDING  Incomplete  Gram stain     Status: None (Preliminary result)   Collection Time: 08/03/19  4:20 PM   Specimen: Fluid  Result Value Ref Range Status   Specimen Description FLUID PERITONEAL  Final   Special Requests SYRINGE  Final   Gram Stain   Final    RARE WBC PRESENT, PREDOMINANTLY MONONUCLEAR NO ORGANISMS SEEN Performed at [facility], [place] [address] [place], [place] [address]    Report Status PENDING  Incomplete         Radiology Studies: US Paracentesis  Result Date: 08/03/2019 INDICATION: Patient with history of hepatitis-C, numerous sclerotic foci in the spine, prominent gastric antral mucosa and adenopathy, ascites. Request made for diagnostic and therapeutic paracentesis. EXAM: ULTRASOUND GUIDED DIAGNOSTIC AND THERAPEUTIC PARACENTESIS MEDICATIONS: None COMPLICATIONS: None immediate. PROCEDURE: Informed written consent was obtained from the patient after a discussion of the risks, benefits and alternatives to treatment. A timeout was performed prior to the initiation of the procedure. Initial ultrasound scanning demonstrates a moderate amount of ascites within the left lower abdominal quadrant. The left lower abdomen was prepped and draped in the usual sterile fashion. 1% lidocaine was used for local anesthesia. Following this, a 6 Fr Safe-T-Centesis catheter was introduced. An ultrasound image was saved for documentation purposes. The paracentesis was performed. The catheter was removed and a dressing was applied. The patient tolerated the procedure well without immediate post procedural complication. FINDINGS: A total of approximately 2.4 liters of hazy, amber fluid was removed. Samples were sent to the laboratory as requested by the clinical team. IMPRESSION: Successful ultrasound-guided diagnostic and therapeutic paracentesis yielding 2.4 liters of peritoneal fluid. Read by: Rowe Robert, PA-C Electronically Signed    By: Jerilynn Mages.  Shick M.D.   On: 08/03/2019 16:02        Scheduled Meds: . sodium chloride   Intravenous Once  . amLODipine  10 mg Oral Daily  . bisacodyl  10 mg Oral Once  . dexamethasone (DECADRON) injection  10 mg Intravenous Q12H  . feeding supplement  1 Container Oral TID BM  . polyethylene glycol  17 g Oral BID  . sodium chloride flush  3 mL Intravenous Q12H   Continuous Infusions: . pantoprazole (PROTONIX) IV 80 mg (Aug 14, 2019 1120)     LOS: 2 days    Time spent: 45 minutes.  Including multiple family meetings and discussion with multiple providers.    Barb Merino, MD [facility] Pager [phone]

## 2019-08-05 NOTE — Progress Notes (Signed)
   01-Sep-2019 1502  Assess: MEWS Score  BP (!) 143/77  Pulse Rate (!) 136  SpO2 90 %  O2 Device Non-rebreather Mask  Patient Activity (if Appropriate) In bed  O2 Flow Rate (L/min) 15 L/min  Assess: MEWS Score  MEWS Temp 0  MEWS Systolic 0  MEWS Pulse 3  MEWS RR 2  MEWS LOC 2  MEWS Score 7  MEWS Score Color Red  Assess: if the MEWS score is Yellow or Red  Were vital signs taken at a resting state? Yes  Focused Assessment Documented focused assessment  Early Detection of Sepsis Score *See Row Information* Low  MEWS guidelines implemented *See Row Information* Yes  Treat  MEWS Interventions Administered prn meds/treatments;Other (Comment) (placed on NRB mask)  Document  Patient Outcome Not stable and remains on department

## 2019-08-05 NOTE — Progress Notes (Signed)
MD aware that Pt has had two large incontinent stools black stools there was noted some red mixed in with the stool.  Per new MD orders IV Dilaudid Drip started.Pt's Wife given information regarding Comfort Measures and emotional support given.

## 2019-08-05 DEATH — deceased

## 2019-08-06 LAB — ACID FAST SMEAR (AFB, MYCOBACTERIA): Acid Fast Smear: NEGATIVE

## 2019-08-06 LAB — GRAM STAIN

## 2019-08-08 LAB — CULTURE, BODY FLUID W GRAM STAIN -BOTTLE: Culture: NO GROWTH

## 2019-08-19 ENCOUNTER — Ambulatory Visit: Payer: Medicare HMO | Admitting: Nurse Practitioner

## 2019-09-05 NOTE — Death Summary Note (Signed)
DEATH SUMMARY   Patient Details  Name: Dylan Kaufman MRN: [id number] DOB: [birth year]  Admission/Discharge Information   Admit Date:  08-25-19  Date of Death: Date of Death: 28-Aug-2019  Time of Death: Time of Death: 04/16/2238  Length of Stay: 3  Referring Physician: Venia Carbon, MD   Reason(s) for Hospitalization  metastatic cancer  Diagnoses  Preliminary cause of death:  Secondary Diagnoses (including complications and co-morbidities):  Principal Problem:   Symptomatic anemia Active Problems:   Hypertension   Ascites   Metastatic bone tumor ([place])   NSTEMI (non-ST elevated myocardial infarction) [facility])   Brief Hospital Course (including significant findings, care, treatment, and services provided and events leading to death)  Dylan Kaufman is a 69 y.o. year old male who has a history of hypertension, hepatitis C status post treatment with Harvoni who presented to the emergency room with several weeks and months of multiple complaints including shortness of breath, abdominal pain and distention, chest pain, abdominal pain and distention, back pain with urinary retention and constipation.  His abdominal scans were consistent with multiple sclerotic bony metastasis, abnormal stomach lesions with ascites, severe symptomatic anemia.  Hemoglobin was 5 on presentation.  CTA was negative for PE.  CT abdomen pelvis with numerous acute sclerotic foci in the spine.  MRI of the brain, MRI of the thoracic and lumbar spine with diffusely abnormal bone marrow signals throughout the spine, multiple pathological fractures and extensive epidural thickening and enhancement in the mid thoracic spine with cord compression.  Patient also had paraplegia.  His troponins were elevated and EKG was abnormal.  Patient was admitted to the hospital for treatment of severe symptomatic anemia, suspect from GI bleeding acute on chronic, suspect from primary GI malignancy with widespread end-stage metastatic cancer,  metastasis to spine, spinal cord compression with paraplegia, confusion and altered mental status.  Symptomatic treatment was started.  PRBC transfusion is started.  Seen by neurosurgery, oncology, radiation oncology.  Also developed non-ST elevation MI, however could not be anticoagulated all taken to cardiac cath because of unstable condition.  Patient was too unstable to undergo endoluminal evaluation.  He was being assessed for treatment, paracentesis was done with pending cytology.  His condition quickly deteriorated resulting in severe metastatic pain, encephalopathy, non-ST elevation MI, paraplegia.  With his untreatable conditions and severe comorbidities with deterioration of symptoms, he was served with comfort care measures and ultimately died in the hospital with wife at the bedside.    Pertinent Labs and Studies  Significant Diagnostic Studies DG Chest 2 View  Result Date: 07/21/2019 CLINICAL DATA:  Left back pain EXAM: CHEST - 2 VIEW COMPARISON:  None. FINDINGS: Heart and mediastinal contours are within normal limits. Left basilar linear densities, likely scarring. No confluent opacities otherwise. No effusions. No acute bony abnormality. Old healed left rib fractures. IMPRESSION: No active cardiopulmonary disease. Electronically Signed   By: Rolm Baptise M.D.   On: 07/21/2019 21:30   DG Thoracic Spine 2 View  Result Date: 07/21/2019 CLINICAL DATA:  Left side back pain EXAM: THORACIC SPINE 2 VIEWS COMPARISON:  None. FINDINGS: No fracture or malalignment.  No focal bone lesion. IMPRESSION: No acute bony abnormality. Electronically Signed   By: Rolm Baptise M.D.   On: 07/21/2019 21:29   DG Lumbar Spine Complete  Result Date: 07/21/2019 CLINICAL DATA:  Left back pain EXAM: LUMBAR SPINE - COMPLETE 4+ VIEW COMPARISON:  None. FINDINGS: Normal alignment. No fracture. Disc spaces maintained. Aortic atherosclerosis. No aneurysm. IMPRESSION: No acute bony  abnormality. Electronically Signed   By:  Rolm Baptise M.D.   On: 07/21/2019 21:30   CT Angio Chest PE W and/or Wo Contrast  Result Date: 08/02/2019 CLINICAL DATA:  Chest pain and shortness of breath.  Constipation EXAM: CT ANGIOGRAPHY CHEST CT ABDOMEN AND PELVIS WITH CONTRAST TECHNIQUE: Multidetector CT imaging of the chest was performed using the standard protocol during bolus administration of intravenous contrast. Multiplanar CT image reconstructions and MIPs were obtained to evaluate the vascular anatomy. Multidetector CT imaging of the abdomen and pelvis was performed using the standard protocol during bolus administration of intravenous contrast. CONTRAST:  185m OMNIPAQUE IOHEXOL 350 MG/ML SOLN COMPARISON:  None. FINDINGS: CTA CHEST FINDINGS Cardiovascular: Normal heart size. No pericardial effusion. There may be hypertrophy of the left ventricle. Heavily calcified aorta, great vessels, and coronary arteries. 40% narrowing of the brachiocephalic artery on coronal reformats. Assessment of the left common carotid atherosclerotic narrowing is limited by streak artifact. That no pulmonary artery filling defect. Mediastinum/Nodes: Negative for adenopathy or mass Lungs/Pleura: Airway plugging in the left lower lobe. Bilateral lower lobe atelectasis and small volume left pleural effusion. Musculoskeletal: Patchy sclerosis in the sternum and throughout the spine primarily worrisome for metastatic disease. Remote bilateral rib fractures. T7 superior endplate deformity without visible acute fracture line. Review of the MIP images confirms the above findings. CT ABDOMEN and PELVIS FINDINGS Hepatobiliary: Noted history of hepatitis-C. No overt cirrhotic changes. Tiny cystic density in the central liver.No evidence of biliary obstruction or stone. Pancreas: Unremarkable. Spleen: Unremarkable. Adrenals/Urinary Tract: Negative adrenals. No hydronephrosis or stone. Right upper pole cortical scarring. Distended urinary bladder without focal abnormality.  Stomach/Bowel: No obstruction or visible inflammation. Prominent enhancement and redundant appearance to the gastric antral mucosa. Moderate stool retention. Vascular/Lymphatic: No acute vascular abnormality. Heavily calcified aorta and iliacs with advanced bilateral iliac and common femoral/superficial femoral artery narrowings. No mass or adenopathy. Reproductive:Probable TURP defect. Other: Large volume ascites which does not appear equally distributed throughout the abdomen. There may be some peritoneal thickening and smooth linear enhancement, no discrete peritoneal nodule. Adenopathy in the gastrohepatic ligament measuring up to 13 mm short axis. Musculoskeletal: Patchy sclerosis in the lumbar spine. There are T12, L1, L2, and L3 superior endplate deformities, age-indeterminate. Review of the MIP images confirms the above findings. IMPRESSION: Abdominal CT: 1. Numerous sclerotic foci in the spine worrisome for metastatic disease. Prominent intrathecal enhancement, suggest MR follow-up if there is myelopathy or polyneuropathy. There are multiple superior endplate fractures, age-indeterminate. 2. A gastric primary is considered given the prominent antral mucosa and adenopathy in the gastrohepatic ligament. 3. Large volume ascites. No discrete peritoneal nodule, but there is some loculation and probable peritoneal thickening, suggest paracentesis with cytology. 4. Severe atherosclerosis. Chest CTA: 1. Negative for pulmonary embolism. 2. Bilateral lower lobe atelectasis with bronchial plugging and trace effusion on the left. 3. Severe atherosclerosis. Electronically Signed   By: JMonte FantasiaM.D.   On: 08/02/2019 04:43   MR BRAIN W WO CONTRAST  Result Date: 08/02/2019 CLINICAL DATA:  69year old male with evidence of spinal metastatic disease on CT Chest, Abdomen, and Pelvis today. EXAM: MRI HEAD WITHOUT AND WITH CONTRAST TECHNIQUE: Multiplanar, multiecho pulse sequences of the brain and surrounding  structures were obtained without and with intravenous contrast. CONTRAST:  677mGADAVIST GADOBUTROL 1 MMOL/ML IV SOLN COMPARISON:  No prior brain imaging. FINDINGS: Brain: There is subtle abnormal diffusion in white matter near the left splenium of the corpus callosum (series 7 image 42). Mild T2 and  FLAIR hyperintensity is associated, with scattered additional patchy white matter T2 and FLAIR hyperintensity elsewhere. Similar T2 and FLAIR hyperintensity in the left basal ganglia compatible with chronic lacunar infarct. No other abnormal diffusion. No cortical encephalomalacia identified. There is a chronic microhemorrhage in the left thalamus. Questionable similar chronic microhemorrhage in the right deep cerebellar nuclei. Motion degraded postcontrast imaging throughout the brain. No definite abnormal parenchymal enhancement. No midline shift, mass effect, ventriculomegaly, extra-axial collection or acute intracranial hemorrhage. Cervicomedullary junction and pituitary are within normal limits. Vascular: Major intracranial vascular flow voids are preserved. Postcontrast images are motion degraded but the superior sagittal sinus, transverse and sigmoid sinuses appear to be grossly patent and enhancing. Skull and upper cervical spine: Abnormal marrow signal in the occipital condyles and throughout the visible cervical spine. Superimposed discrete and expansile bone mass at the left lower temporal bone (series 9, image 18, series 5, image 52. Associated mild left mastoid effusion. No other destructive skull lesion identified. Sinuses/Orbits: Negative. Other: Right mastoids remain clear.  No scalp soft tissue lesion. IMPRESSION: 1. Positive for bone metastases to the skull base and visible cervical spine, including a mildly expansile lesion of the left temporal bone. Mild associated left mastoid effusion. 2. Substantially degraded postcontrast images of the brain with no obvious brain metastasis. A repeat staging Brain  MRI when the patient can better cooperate would be valuable. 3. Evidence of small vessel disease with possible recent white matter ischemia near the left splenium. Electronically Signed   By: Genevie Ann M.D.   On: 08/02/2019 14:22   MR THORACIC SPINE W WO CONTRAST  Result Date: 08/02/2019 CLINICAL DATA:  Back pain. Abnormal spine on CT. Chronic hepatitis C infection. EXAM: MRI THORACIC WITHOUT AND WITH CONTRAST TECHNIQUE: Multiplanar and multiecho pulse sequences of the thoracic spine were obtained without and with intravenous contrast. CONTRAST:  64m GADAVIST GADOBUTROL 1 MMOL/ML IV SOLN COMPARISON:  CT chest abdomen pelvis earlier today. FINDINGS: MRI THORACIC SPINE FINDINGS Alignment:  Normal Vertebrae: Diffusely abnormal bone marrow throughout the thoracic spine with diffuse low signal intensity suggesting a diffuse infiltrative process. Scattered sclerotic lesions are present at several levels on CT. Mild compression fracture of T5 with bone marrow edema. Mild compression fractures of T7, T8, T9, T10 with bone marrow edema. Mild compression fracture T12 with mild bone marrow edema. There is heterogeneous enhancement of the bone marrow involving these fractures. Cord: There is spinal stenosis due to epidural soft tissue thickening from T6 through T10. This is causing cord flattening and mild cord compression. No cord signal abnormality is identified. Paraspinal and other soft tissues: Epidural soft tissue thickening and enhancement from T6 through T10. There appears to be infiltration of the posterior epidural fat with enhancement. This is predominately posterior but appears circumferential. This is contributing to moderate spinal stenosis Disc levels: Negative for disc protrusion.  Negative for disc space infection. IMPRESSION: Diffusely abnormal bone marrow throughout the thoracic spine suggesting an infiltrative process such as lymphoma, metastatic disease, or multiple myeloma. Multiple pathologic fractures  that appear recent involving T5, T7, T9, T10, T12 Extensive epidural thickening and enhancement in the midthoracic spine causing compression of the spinal cord. 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 These results were called by telephone at the time of interpretation on 08/02/2019 at 2:48 pm to provider Matcha, who verbally acknowledged these results. Electronically Signed   By: CFranchot GalloM.D.   On: 08/02/2019 14:51   MR Lumbar Spine W Wo Contrast  Result Date: 08/02/2019 CLINICAL DATA:  Back  pain.  Abnormal CT of the spine. EXAM: MRI LUMBAR SPINE WITHOUT AND WITH CONTRAST TECHNIQUE: Multiplanar and multiecho pulse sequences of the lumbar spine were obtained without and with intravenous contrast. CONTRAST:  48m GADAVIST GADOBUTROL 1 MMOL/ML IV SOLN COMPARISON:  CT  chest abdomen pelvis 08/02/2019 FINDINGS: Segmentation:  Normal Alignment:  Normal Vertebrae: Diffusely abnormal bone marrow which is markedly low signal on T1. Mild superior and inferior endplate fractures are present throughout the lumbar spine. There is mild edema involving the T12 and L2 fracture which may be recent. No focal mass lesion is identified. Conus medullaris and cauda equina: Conus extends to the L1-2 level. Conus and cauda equina appear normal. Paraspinal and other soft tissues: Moderate ascites. Liver and spleen are diffusely low signal. Patient has a history of chronic hepatitis C infection. No paraspinous adenopathy. Disc levels: L1-2: Negative L2-3: Negative L3-4: Small central disc protrusion.  Negative for stenosis L4-5: Small central disc protrusion.  Negative for stenosis L5-S1: Negative IMPRESSION: Diffusely abnormal bone marrow. The patient has severe anemia. No focal lesion identified. Possible lymphoma or myeloproliferative disorder. Also consider widespread metastatic disease or myeloma. Consider bone marrow biopsy. Multiple mild compression fractures throughout the lumbar spine. There is mild edema at T12 and L2 suggesting recent  fractures. These are mild. Moderate ascites. Diffusely low signal in the liver and spleen which may reflect hemochromatosis. Electronically Signed   By: CFranchot GalloM.D.   On: 08/02/2019 14:25   CT ABDOMEN PELVIS W CONTRAST  Result Date: 08/02/2019 CLINICAL DATA:  Chest pain and shortness of breath.  Constipation EXAM: CT ANGIOGRAPHY CHEST CT ABDOMEN AND PELVIS WITH CONTRAST TECHNIQUE: Multidetector CT imaging of the chest was performed using the standard protocol during bolus administration of intravenous contrast. Multiplanar CT image reconstructions and MIPs were obtained to evaluate the vascular anatomy. Multidetector CT imaging of the abdomen and pelvis was performed using the standard protocol during bolus administration of intravenous contrast. CONTRAST:  1074mOMNIPAQUE IOHEXOL 350 MG/ML SOLN COMPARISON:  None. FINDINGS: CTA CHEST FINDINGS Cardiovascular: Normal heart size. No pericardial effusion. There may be hypertrophy of the left ventricle. Heavily calcified aorta, great vessels, and coronary arteries. 40% narrowing of the brachiocephalic artery on coronal reformats. Assessment of the left common carotid atherosclerotic narrowing is limited by streak artifact. That no pulmonary artery filling defect. Mediastinum/Nodes: Negative for adenopathy or mass Lungs/Pleura: Airway plugging in the left lower lobe. Bilateral lower lobe atelectasis and small volume left pleural effusion. Musculoskeletal: Patchy sclerosis in the sternum and throughout the spine primarily worrisome for metastatic disease. Remote bilateral rib fractures. T7 superior endplate deformity without visible acute fracture line. Review of the MIP images confirms the above findings. CT ABDOMEN and PELVIS FINDINGS Hepatobiliary: Noted history of hepatitis-C. No overt cirrhotic changes. Tiny cystic density in the central liver.No evidence of biliary obstruction or stone. Pancreas: Unremarkable. Spleen: Unremarkable. Adrenals/Urinary Tract:  Negative adrenals. No hydronephrosis or stone. Right upper pole cortical scarring. Distended urinary bladder without focal abnormality. Stomach/Bowel: No obstruction or visible inflammation. Prominent enhancement and redundant appearance to the gastric antral mucosa. Moderate stool retention. Vascular/Lymphatic: No acute vascular abnormality. Heavily calcified aorta and iliacs with advanced bilateral iliac and common femoral/superficial femoral artery narrowings. No mass or adenopathy. Reproductive:Probable TURP defect. Other: Large volume ascites which does not appear equally distributed throughout the abdomen. There may be some peritoneal thickening and smooth linear enhancement, no discrete peritoneal nodule. Adenopathy in the gastrohepatic ligament measuring up to 13 mm short axis. Musculoskeletal: Patchy sclerosis  in the lumbar spine. There are T12, L1, L2, and L3 superior endplate deformities, age-indeterminate. Review of the MIP images confirms the above findings. IMPRESSION: Abdominal CT: 1. Numerous sclerotic foci in the spine worrisome for metastatic disease. Prominent intrathecal enhancement, suggest MR follow-up if there is myelopathy or polyneuropathy. There are multiple superior endplate fractures, age-indeterminate. 2. A gastric primary is considered given the prominent antral mucosa and adenopathy in the gastrohepatic ligament. 3. Large volume ascites. No discrete peritoneal nodule, but there is some loculation and probable peritoneal thickening, suggest paracentesis with cytology. 4. Severe atherosclerosis. Chest CTA: 1. Negative for pulmonary embolism. 2. Bilateral lower lobe atelectasis with bronchial plugging and trace effusion on the left. 3. Severe atherosclerosis. Electronically Signed   By: Monte Fantasia M.D.   On: 08/02/2019 04:43   US Paracentesis  Result Date: 08/03/2019 INDICATION: Patient with history of hepatitis-C, numerous sclerotic foci in the spine, prominent gastric antral  mucosa and adenopathy, ascites. Request made for diagnostic and therapeutic paracentesis. EXAM: ULTRASOUND GUIDED DIAGNOSTIC AND THERAPEUTIC PARACENTESIS MEDICATIONS: None COMPLICATIONS: None immediate. PROCEDURE: Informed written consent was obtained from the patient after a discussion of the risks, benefits and alternatives to treatment. A timeout was performed prior to the initiation of the procedure. Initial ultrasound scanning demonstrates a moderate amount of ascites within the left lower abdominal quadrant. The left lower abdomen was prepped and draped in the usual sterile fashion. 1% lidocaine was used for local anesthesia. Following this, a 6 Fr Safe-T-Centesis catheter was introduced. An ultrasound image was saved for documentation purposes. The paracentesis was performed. The catheter was removed and a dressing was applied. The patient tolerated the procedure well without immediate post procedural complication. FINDINGS: A total of approximately 2.4 liters of hazy, amber fluid was removed. Samples were sent to the laboratory as requested by the clinical team. IMPRESSION: Successful ultrasound-guided diagnostic and therapeutic paracentesis yielding 2.4 liters of peritoneal fluid. Read by: Rowe Robert, PA-C Electronically Signed   By: Jerilynn Mages.  Shick M.D.   On: 08/03/2019 16:02   ECHOCARDIOGRAM COMPLETE  Result Date: 08/02/2019    ECHOCARDIOGRAM REPORT   Patient Name:   [facility] Date of Exam: 08/02/2019 Medical Rec #:  [id number]    Height:       66.0 in Accession #:    [id number]   Weight:       127.0 lb Date of Birth:  03/22/1950   BSA:          1.649 m Patient Age:    88 years     BP:           128/72 mmHg Patient Gender: M            HR:           104 bpm. Exam Location:  Inpatient Procedure: 2D Echo, Cardiac Doppler and Color Doppler Indications:    122-I22.9 Subsequent ST elevation (STEM) and non-ST elevation                 (NSTEMI) myocardial infarction  History:        Patient has no prior  history of Echocardiogram examinations.                 Signs/Symptoms:Chest Pain and Dyspnea; Risk Factors:Current                 Smoker and Hypertension. HEP C.  Sonographer:    Roseanna Rainbow RDCS Referring Phys: [id number] [facility] A THOMAS  Sonographer Comments: Technically difficult study  due to poor echo windows. Patient in pain and in supine position. Patient asked me to stop pressing on him in apical region. IMPRESSIONS  1. Left ventricular There is mild left ventricular hypertrophy. Apical window is forshortened. The LVEF is approximately 50 to 55% with apical hypokinesis.  2. Right ventricular systolic function is normal. The right ventricular size is normal.  3. The mitral valve is normal in structure. Trivial mitral valve regurgitation.  4. The aortic valve is tricuspid. Aortic valve regurgitation is not visualized. Mild aortic valve sclerosis is present, with no evidence of aortic valve stenosis.  5. The inferior vena cava is normal in size with greater than 50% respiratory variability, suggesting right atrial pressure of 3 mmHg. FINDINGS  Left Ventricle: Left ventricular ejection fraction, by estimation, is 50 to 55%. The left ventricle has low normal function. The left ventricle demonstrates regional wall motion abnormalities. The left ventricular internal cavity size was normal in size. There is mild left ventricular hypertrophy. Left ventricular diastolic parameters are consistent with Grade I diastolic dysfunction (impaired relaxation). Right Ventricle: The right ventricular size is normal. Right vetricular wall thickness was not assessed. Right ventricular systolic function is normal. Left Atrium: Left atrial size was normal in size. Right Atrium: Right atrial size was normal in size. Pericardium: There is no evidence of pericardial effusion. Mitral Valve: The mitral valve is normal in structure. Trivial mitral valve regurgitation. Tricuspid Valve: The tricuspid valve is normal in structure. Tricuspid  valve regurgitation is trivial. Aortic Valve: The aortic valve is tricuspid. Aortic valve regurgitation is not visualized. Mild aortic valve sclerosis is present, with no evidence of aortic valve stenosis. Pulmonic Valve: The pulmonic valve was not well visualized. Pulmonic valve regurgitation is trivial. Aorta: The aortic root is normal in size and structure. Venous: The inferior vena cava is normal in size with greater than 50% respiratory variability, suggesting right atrial pressure of 3 mmHg. IAS/Shunts: No atrial level shunt detected by color flow Doppler.  LEFT VENTRICLE PLAX 2D LVIDd:         3.80 cm     Diastology LVIDs:         2.70 cm     LV e' lateral:   8.49 cm/s LV PW:         1.20 cm     LV E/e' lateral: 6.6 LV IVS:        1.10 cm     LV e' medial:    5.77 cm/s LVOT diam:     2.00 cm     LV E/e' medial:  9.7 LV SV:         62 LV SV Index:   38 LVOT Area:     3.14 cm  LV Volumes (MOD) LV vol d, MOD A2C: 67.0 ml LV vol d, MOD A4C: 71.4 ml LV vol s, MOD A2C: 28.6 ml LV vol s, MOD A4C: 38.2 ml LV SV MOD A2C:     38.4 ml LV SV MOD A4C:     71.4 ml LV SV MOD BP:      35.7 ml RIGHT VENTRICLE             IVC RV S prime:     18.20 cm/s  IVC diam: 1.50 cm TAPSE (M-mode): 2.3 cm LEFT ATRIUM             Index       RIGHT ATRIUM          Index LA diam:  3.50 cm 2.12 cm/m  RA Area:     7.89 cm LA Vol (A2C):   28.1 ml 17.04 ml/m RA Volume:   14.00 ml 8.49 ml/m LA Vol (A4C):   16.2 ml 9.83 ml/m LA Biplane Vol: 22.3 ml 13.53 ml/m  AORTIC VALVE LVOT Vmax:   105.00 cm/s LVOT Vmean:  67.000 cm/s LVOT VTI:    0.197 m  AORTA Ao Root diam: 3.00 cm Ao Asc diam:  3.20 cm MITRAL VALVE MV Area (PHT): 4.15 cm    SHUNTS MV Decel Time: 183 msec    Systemic VTI:  0.20 m MV E velocity: 55.70 cm/s  Systemic Diam: 2.00 cm MV A velocity: 99.00 cm/s MV E/A ratio:  0.56 Dorris Carnes MD Electronically signed by Dorris Carnes MD Signature Date/Time: 08/02/2019/2:54:24 PM    Final     Microbiology Recent Results (from the past  240 hour(s))  SARS Coronavirus 2 by RT PCR (hospital order, performed in [place] hospital lab) Nasopharyngeal Nasopharyngeal Swab     Status: None   Collection Time: 08/02/19  5:52 AM   Specimen: Nasopharyngeal Swab  Result Value Ref Range Status   SARS Coronavirus 2 NEGATIVE NEGATIVE Final    Comment: (NOTE) SARS-CoV-2 target nucleic acids are NOT DETECTED.  The SARS-CoV-2 RNA is generally detectable in upper and lower respiratory specimens during the acute phase of infection. The lowest concentration of SARS-CoV-2 viral copies this assay can detect is 250 copies / mL. A negative result does not preclude SARS-CoV-2 infection and should not be used as the sole basis for treatment or other patient management decisions.  A negative result may occur with improper specimen collection / handling, submission of specimen other than nasopharyngeal swab, presence of viral mutation(s) within the areas targeted by this assay, and inadequate number of viral copies (<250 copies / mL). A negative result must be combined with clinical observations, patient history, and epidemiological information.  Fact Sheet for Patients:   StrictlyIdeas.no  Fact Sheet for Healthcare Providers: BankingDealers.co.za  This test is not yet approved or  cleared by the [place] FDA and has been authorized for detection and/or diagnosis of SARS-CoV-2 by FDA under an Emergency Use Authorization (EUA).  This EUA will remain in effect (meaning this test can be used) for the duration of the COVID-19 declaration under Section 564(b)(1) of the Act, 21 U.S.C. section 360bbb-3(b)(1), unless the authorization is terminated or revoked sooner.  Performed at [facility], [place] [address] [place], [place] [address]   Culture, Urine     Status: None   Collection Time: 08/03/19  1:56 PM   Specimen: Urine, Catheterized  Result Value Ref Range Status   Specimen  Description   Final    URINE, CATHETERIZED Performed at [facility] [address], [place], [place] [address]    Special Requests   Final    NONE Performed at [facility], [place] [address], [place], [place] [address]    Culture   Final    NO GROWTH Performed at [facility], [place] [address], [place], [place] [address]    Report Status 08/07/2019 FINAL  Final  Acid Fast Smear (AFB)     Status: None   Collection Time: 08/03/19  4:20 PM   Specimen: PATH Cytology Peritoneal fluid  Result Value Ref Range Status   AFB Specimen Processing Concentration  Final   Acid Fast Smear Negative  Final    Comment: (NOTE) Performed At: [facility]  [facility] [place], [place] [id number] Rush Farmer MD KV:[id number]    Source (AFB) PERITONEAL  Final    Comment: Performed at [facility] [address], [place], [place] [address]  Culture, body fluid-bottle     Status: None (Preliminary result)   Collection Time: 08/03/19  4:20 PM   Specimen: Fluid  Result Value Ref Range Status   Specimen Description FLUID PERITONEAL  Final   Special Requests   Final    BOTTLES DRAWN AEROBIC AND ANAEROBIC Blood Culture results may not be optimal due to an excessive volume of blood received in culture bottles   Culture   Final    NO GROWTH 3 DAYS Performed at [facility], [place] [address] [place], [place] [address]    Report Status PENDING  Incomplete  Gram stain     Status: None (Preliminary result)   Collection Time: 08/03/19  4:20 PM   Specimen: Fluid  Result Value Ref Range Status   Specimen Description FLUID PERITONEAL  Final   Special Requests SYRINGE  Final   Gram Stain   Final    RARE WBC PRESENT, PREDOMINANTLY MONONUCLEAR NO ORGANISMS SEEN Performed at [facility], [place] [address], [place], [place] [address]    Report Status PENDING  Incomplete    Lab Basic Metabolic Panel: Recent Labs  Lab  08/02/19 0242 08/03/19 1100  NA 139 145  K 3.3* 4.1  CL 100 111  CO2 23 23  GLUCOSE 150* 134*  BUN 36* 56*  CREATININE 0.95 0.82  CALCIUM 8.7* 8.2*   Liver Function Tests: Recent Labs  Lab 08/02/19 0242 08/03/19 1100  AST 65* 63*  ALT 30 28  ALKPHOS 1,170* 918*  BILITOT 0.7 0.8  PROT 6.8 6.4*  ALBUMIN 3.1* 2.9*   Recent Labs  Lab 08/02/19 0242  LIPASE 31   No results for input(s): AMMONIA in the last 168 hours. CBC: Recent Labs  Lab 08/02/19 0242 08/02/19 1500 08/03/19 1100 08/03/19 1401 Aug 05, 2019 0747  WBC 13.3*  --  10.5  --  6.1  HGB 5.9* 8.5* 6.9* 7.0* 7.5*  HCT 18.3* 26.2* 20.2* 21.5* 21.7*  MCV 88.8  --  85.2  --  85.8  PLT 74*  --  44*  --  37*   Cardiac Enzymes: No results for input(s): CKTOTAL, CKMB, CKMBINDEX, TROPONINI in the last 168 hours. Sepsis Labs: Recent Labs  Lab 08/02/19 0242 08/02/19 0421 08/02/19 0815 08/03/19 1100 August 05, 2019 0747  WBC 13.3*  --   --  10.5 6.1  LATICACIDVEN  --  2.0* 1.2  --   --     Procedures/Operations  Paracentesis, cytology pending.   Dante Gang Leoncio Hansen 08/06/2019, 11:55 AM

## 2019-09-05 DEATH — deceased

## 2019-09-17 LAB — ACID FAST CULTURE WITH REFLEXED SENSITIVITIES (MYCOBACTERIA): Acid Fast Culture: NEGATIVE

## 2019-12-08 ENCOUNTER — Encounter: Payer: Medicare HMO | Admitting: Internal Medicine

## 2021-10-23 IMAGING — CR DG LUMBAR SPINE COMPLETE 4+V
5 series · 5 of 5 positions shown · non-contrast
Comparison: None.

CLINICAL DATA: Left back pain

EXAM:
LUMBAR SPINE - COMPLETE 4+ VIEW

[l-spine ap]
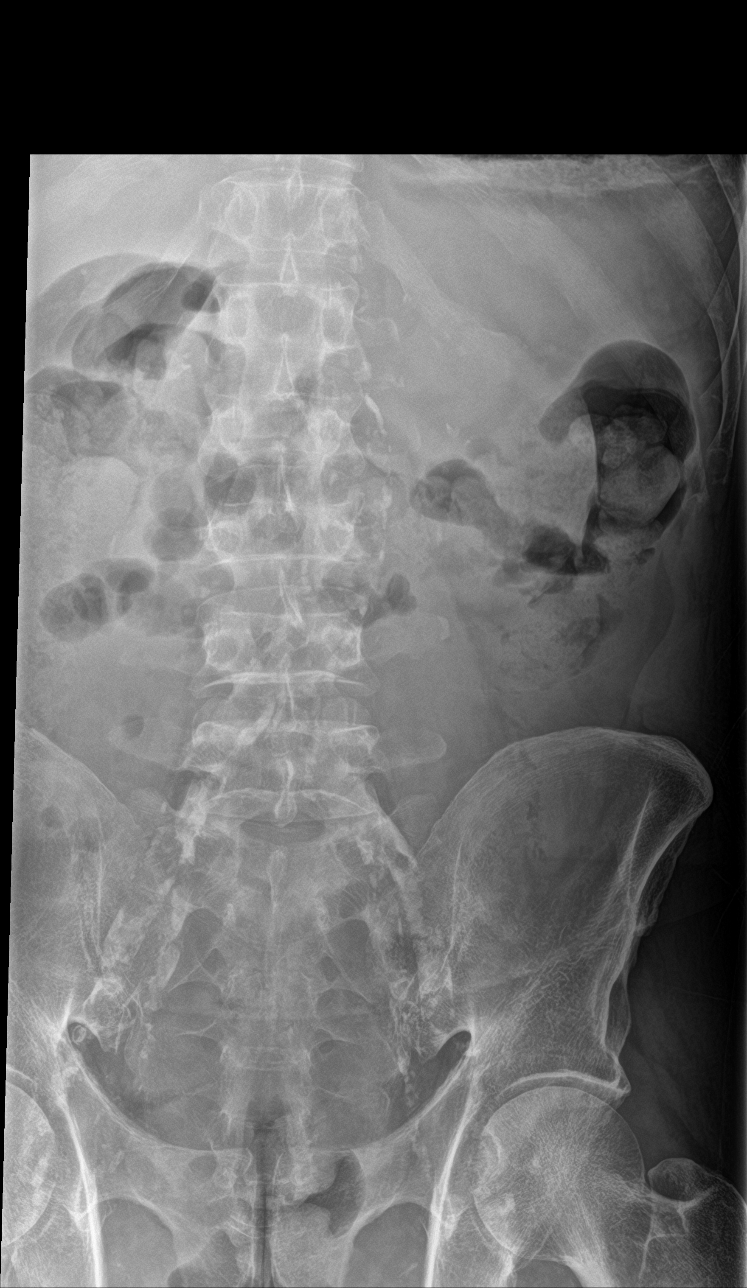

[l-spine obl (1 of 2)]
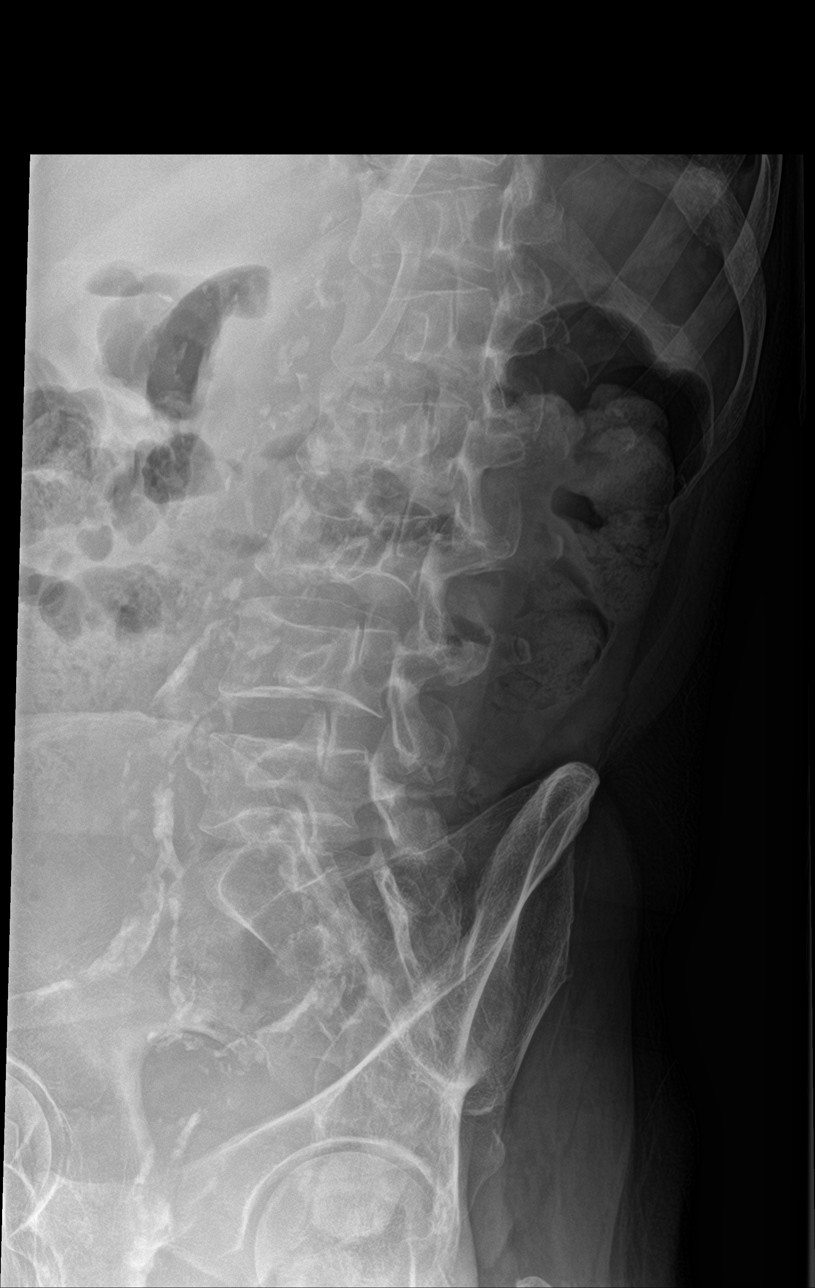

[l-spine obl (2 of 2)]
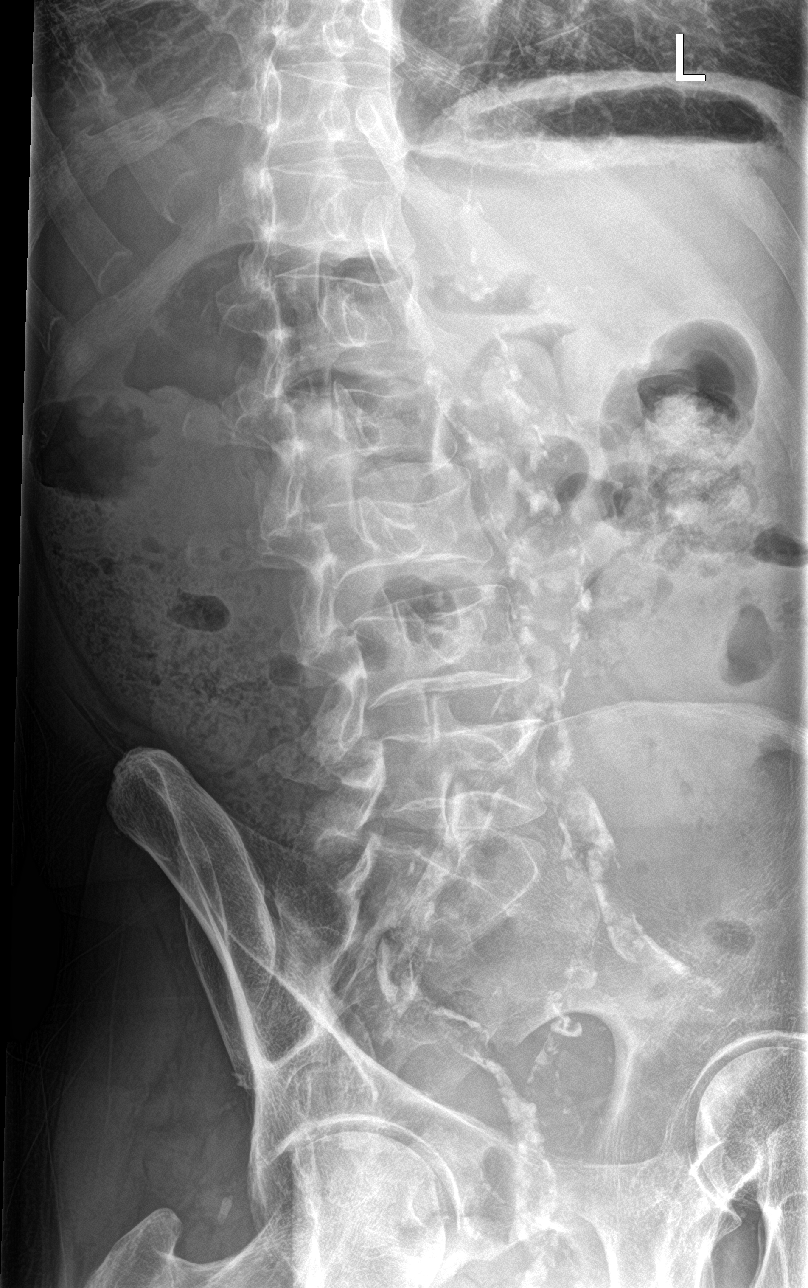

[l-spine lat]
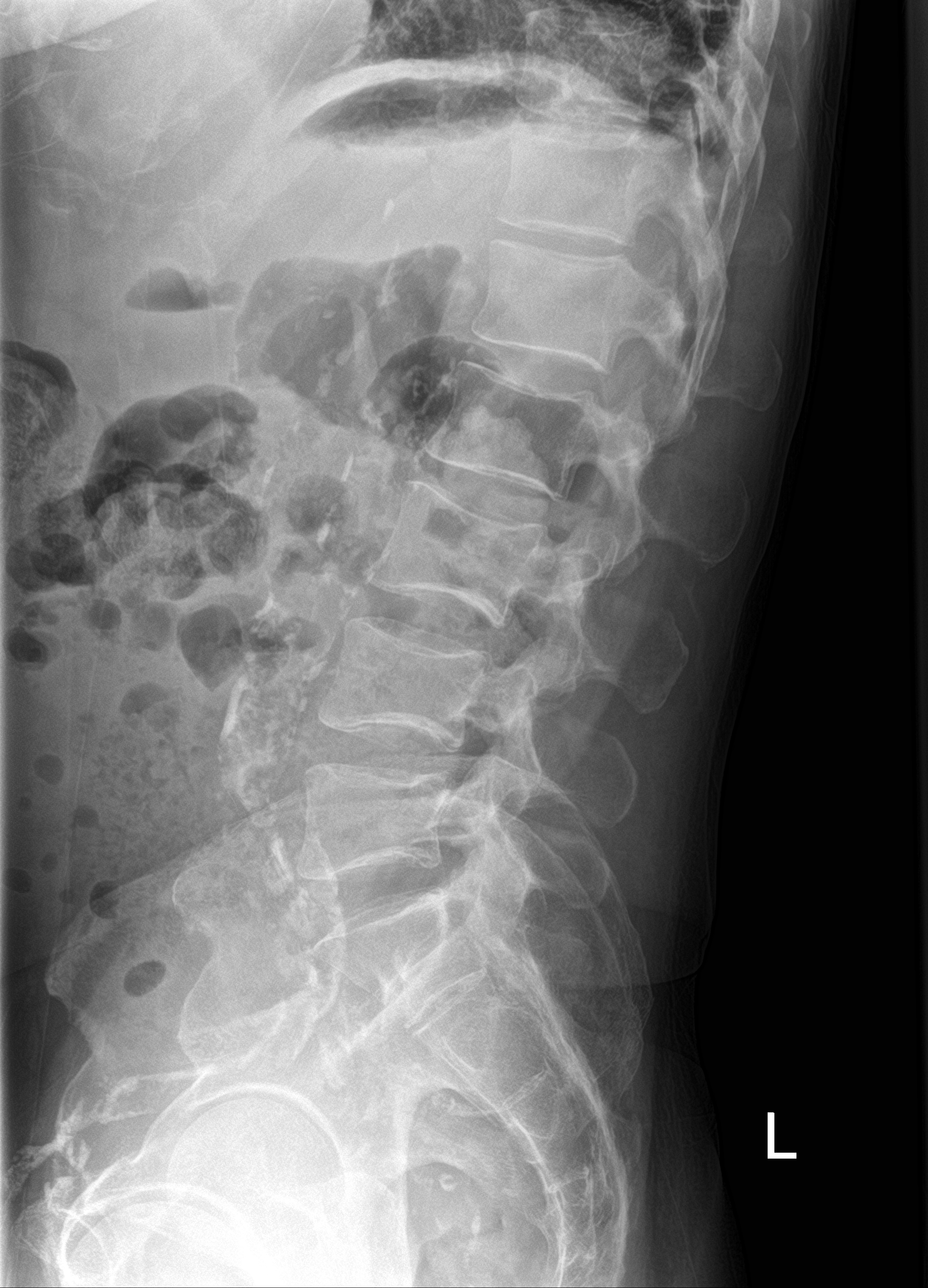

[l-spine spot]
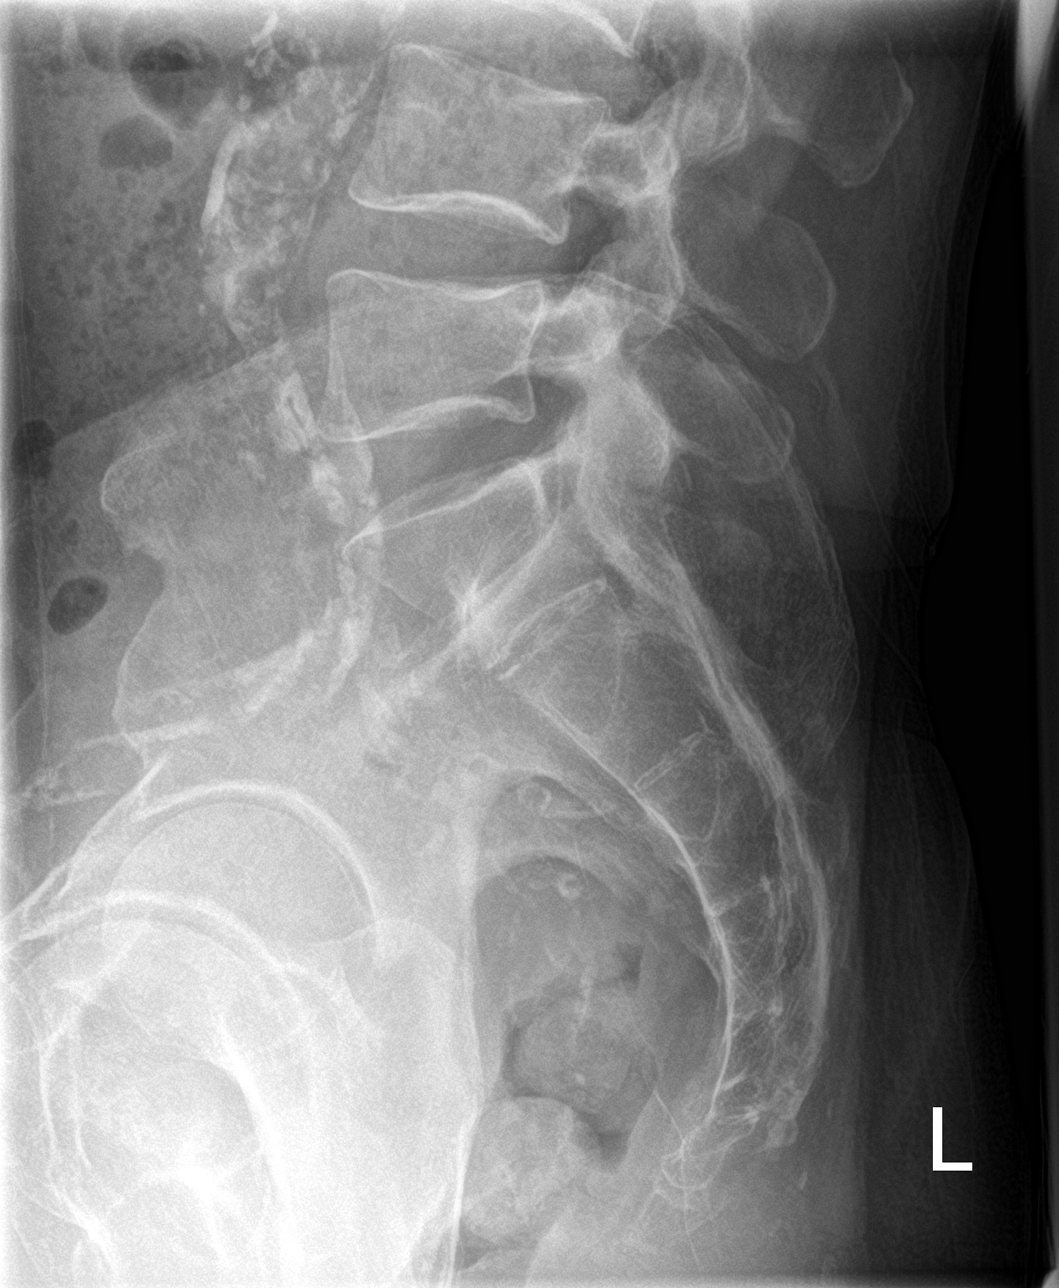

[5 of 5 positions shown; findings below may reference images not displayed]

FINDINGS: Normal alignment. No fracture. Disc spaces maintained. Aortic
atherosclerosis. No aneurysm.
IMPRESSION: No acute bony abnormality.

## 2021-10-23 IMAGING — CR DG THORACIC SPINE 2V
3 series · 3 of 3 positions shown · non-contrast
Comparison: None.

CLINICAL DATA: Left side back pain

EXAM:
THORACIC SPINE 2 VIEWS

[t-spine ap]
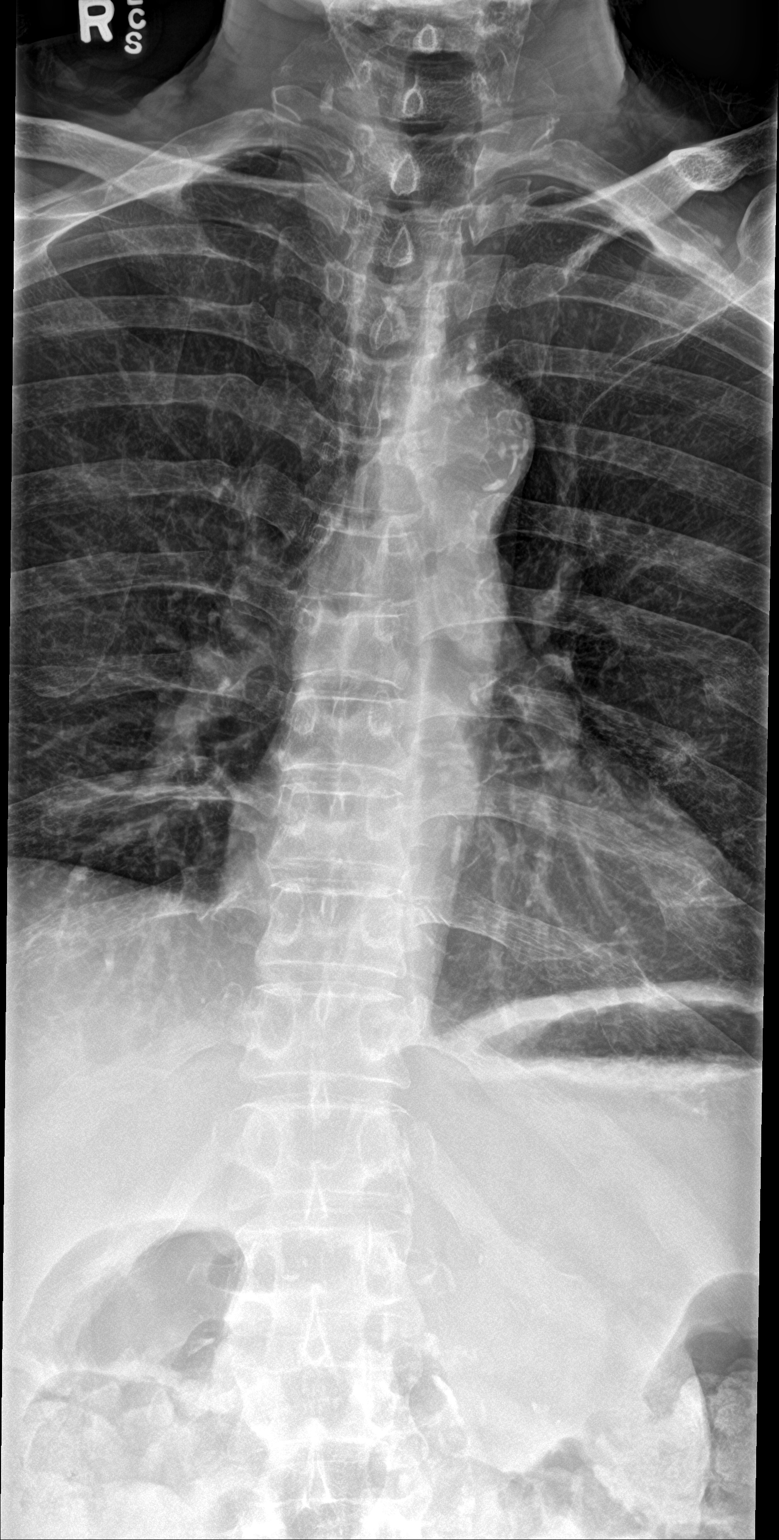

[t-spine lat]
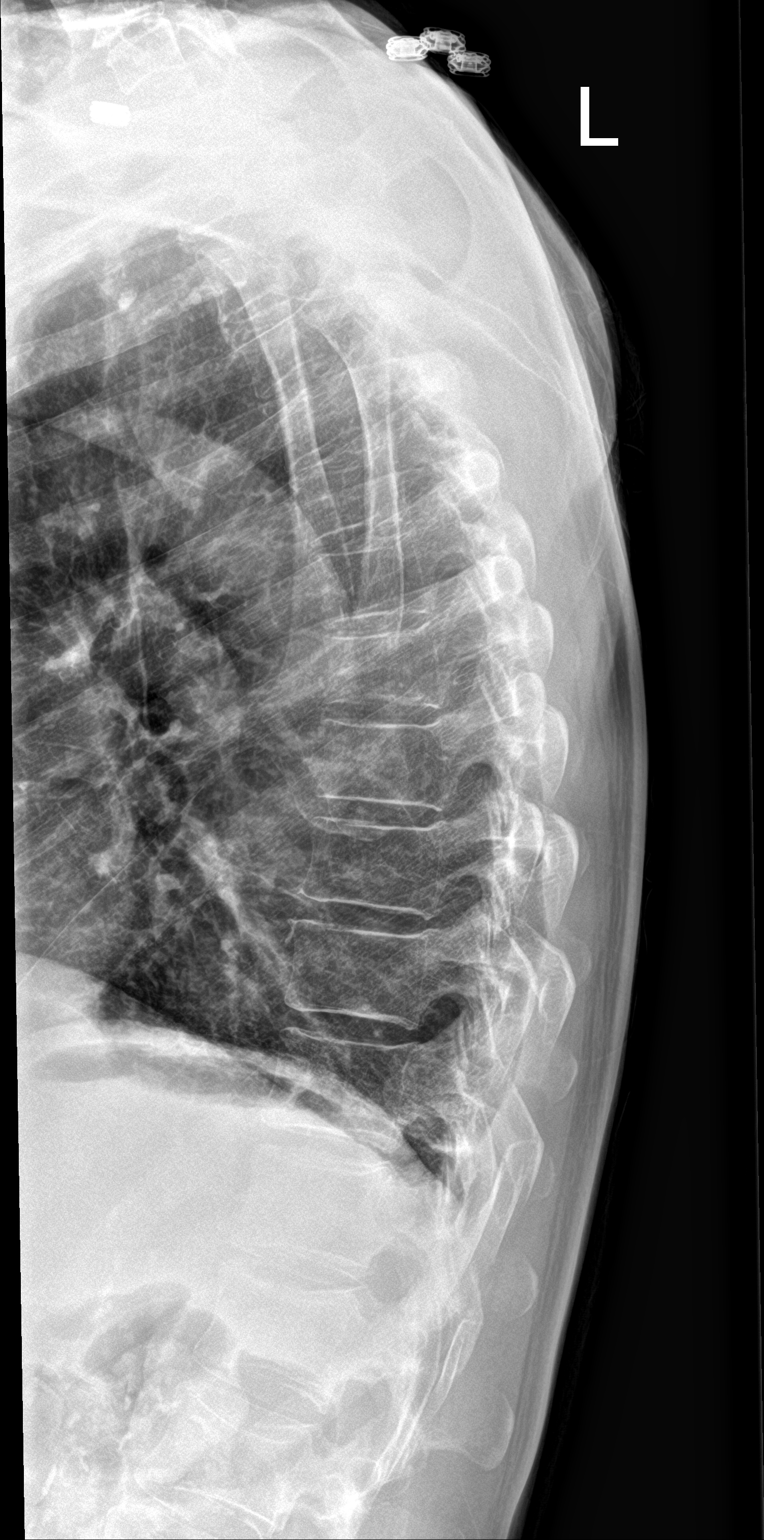

[t-spine swimmers]
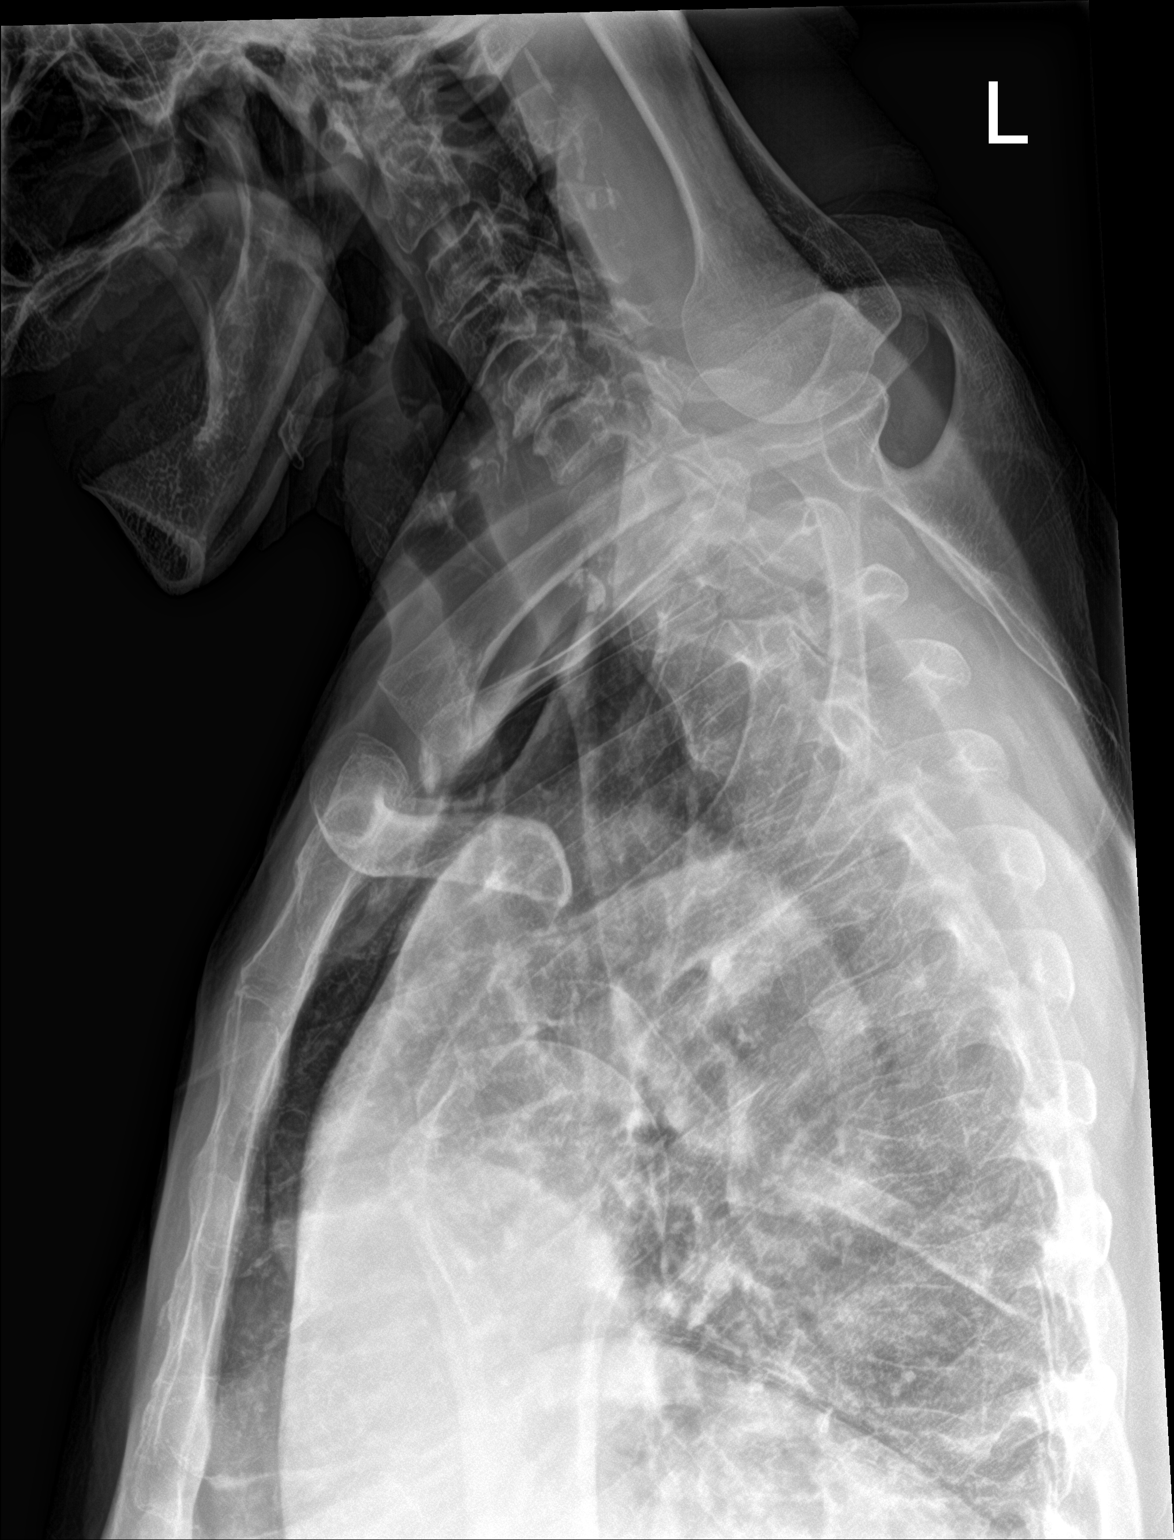

[3 of 3 positions shown; findings below may reference images not displayed]

FINDINGS: No fracture or malalignment.  No focal bone lesion.
IMPRESSION: No acute bony abnormality.
